# Patient Record
Sex: Male | Born: 1945 | Race: White | Hispanic: No | Marital: Married | Smoking: Never smoker
Health system: Southern US, Community
[De-identification: ages and names within clinical notes are randomized; demographics above are authoritative.]

## PROBLEM LIST (undated history)

## (undated) DIAGNOSIS — I10 Essential (primary) hypertension: Secondary | ICD-10-CM

## (undated) DIAGNOSIS — I4891 Unspecified atrial fibrillation: Secondary | ICD-10-CM

## (undated) DIAGNOSIS — I2510 Atherosclerotic heart disease of native coronary artery without angina pectoris: Secondary | ICD-10-CM

## (undated) DIAGNOSIS — E785 Hyperlipidemia, unspecified: Secondary | ICD-10-CM

## (undated) HISTORY — DX: Atherosclerotic heart disease of native coronary artery without angina pectoris: I25.10

## (undated) HISTORY — DX: Essential (primary) hypertension: I10

## (undated) HISTORY — DX: Unspecified atrial fibrillation: I48.91

## (undated) HISTORY — DX: Hyperlipidemia, unspecified: E78.5

---

## 1999-01-28 ENCOUNTER — Encounter: Payer: Self-pay | Admitting: *Deleted

## 1999-01-28 ENCOUNTER — Ambulatory Visit (HOSPITAL_COMMUNITY): Admission: RE | Admit: 1999-01-28 | Discharge: 1999-01-28 | Payer: Self-pay | Admitting: *Deleted

## 2002-07-10 ENCOUNTER — Encounter: Admission: RE | Admit: 2002-07-10 | Discharge: 2002-07-10 | Payer: Self-pay | Admitting: Internal Medicine

## 2002-07-10 ENCOUNTER — Encounter: Payer: Self-pay | Admitting: Internal Medicine

## 2003-12-13 ENCOUNTER — Encounter: Admission: RE | Admit: 2003-12-13 | Discharge: 2003-12-13 | Payer: Self-pay | Admitting: *Deleted

## 2011-08-21 ENCOUNTER — Encounter (INDEPENDENT_AMBULATORY_CARE_PROVIDER_SITE_OTHER): Payer: Federal, State, Local not specified - PPO | Admitting: Ophthalmology

## 2011-08-21 DIAGNOSIS — H3530 Unspecified macular degeneration: Secondary | ICD-10-CM

## 2011-08-21 DIAGNOSIS — H2510 Age-related nuclear cataract, unspecified eye: Secondary | ICD-10-CM

## 2011-08-21 DIAGNOSIS — H43819 Vitreous degeneration, unspecified eye: Secondary | ICD-10-CM

## 2011-10-01 ENCOUNTER — Encounter (INDEPENDENT_AMBULATORY_CARE_PROVIDER_SITE_OTHER): Payer: Federal, State, Local not specified - PPO | Admitting: Ophthalmology

## 2011-10-05 ENCOUNTER — Encounter (INDEPENDENT_AMBULATORY_CARE_PROVIDER_SITE_OTHER): Payer: Federal, State, Local not specified - PPO | Admitting: Ophthalmology

## 2011-10-05 DIAGNOSIS — H3530 Unspecified macular degeneration: Secondary | ICD-10-CM

## 2011-10-05 DIAGNOSIS — H2510 Age-related nuclear cataract, unspecified eye: Secondary | ICD-10-CM

## 2011-10-05 DIAGNOSIS — H43819 Vitreous degeneration, unspecified eye: Secondary | ICD-10-CM

## 2013-01-11 ENCOUNTER — Encounter (INDEPENDENT_AMBULATORY_CARE_PROVIDER_SITE_OTHER): Payer: Federal, State, Local not specified - PPO | Admitting: Ophthalmology

## 2013-01-11 DIAGNOSIS — I10 Essential (primary) hypertension: Secondary | ICD-10-CM

## 2013-01-11 DIAGNOSIS — H35039 Hypertensive retinopathy, unspecified eye: Secondary | ICD-10-CM

## 2013-01-11 DIAGNOSIS — H2510 Age-related nuclear cataract, unspecified eye: Secondary | ICD-10-CM

## 2013-01-11 DIAGNOSIS — H43819 Vitreous degeneration, unspecified eye: Secondary | ICD-10-CM

## 2013-01-11 DIAGNOSIS — H3530 Unspecified macular degeneration: Secondary | ICD-10-CM

## 2015-11-15 DIAGNOSIS — H40052 Ocular hypertension, left eye: Secondary | ICD-10-CM | POA: Diagnosis not present

## 2015-11-15 DIAGNOSIS — H40051 Ocular hypertension, right eye: Secondary | ICD-10-CM | POA: Diagnosis not present

## 2015-11-15 DIAGNOSIS — H40013 Open angle with borderline findings, low risk, bilateral: Secondary | ICD-10-CM | POA: Diagnosis not present

## 2016-03-16 DIAGNOSIS — K080 Exfoliation of teeth due to systemic causes: Secondary | ICD-10-CM | POA: Diagnosis not present

## 2016-06-11 DIAGNOSIS — Z23 Encounter for immunization: Secondary | ICD-10-CM | POA: Diagnosis not present

## 2016-06-12 DIAGNOSIS — H35033 Hypertensive retinopathy, bilateral: Secondary | ICD-10-CM | POA: Diagnosis not present

## 2016-06-12 DIAGNOSIS — H35363 Drusen (degenerative) of macula, bilateral: Secondary | ICD-10-CM | POA: Diagnosis not present

## 2016-06-12 DIAGNOSIS — H43813 Vitreous degeneration, bilateral: Secondary | ICD-10-CM | POA: Diagnosis not present

## 2016-06-12 DIAGNOSIS — H40013 Open angle with borderline findings, low risk, bilateral: Secondary | ICD-10-CM | POA: Diagnosis not present

## 2016-08-18 DIAGNOSIS — K5730 Diverticulosis of large intestine without perforation or abscess without bleeding: Secondary | ICD-10-CM | POA: Diagnosis not present

## 2016-08-18 DIAGNOSIS — Z79899 Other long term (current) drug therapy: Secondary | ICD-10-CM | POA: Diagnosis not present

## 2016-08-18 DIAGNOSIS — Z0000 Encounter for general adult medical examination without abnormal findings: Secondary | ICD-10-CM | POA: Diagnosis not present

## 2016-08-18 DIAGNOSIS — E559 Vitamin D deficiency, unspecified: Secondary | ICD-10-CM | POA: Diagnosis not present

## 2016-08-18 DIAGNOSIS — I10 Essential (primary) hypertension: Secondary | ICD-10-CM | POA: Diagnosis not present

## 2016-08-18 DIAGNOSIS — Z125 Encounter for screening for malignant neoplasm of prostate: Secondary | ICD-10-CM | POA: Diagnosis not present

## 2016-08-18 DIAGNOSIS — E785 Hyperlipidemia, unspecified: Secondary | ICD-10-CM | POA: Diagnosis not present

## 2016-10-07 DIAGNOSIS — K080 Exfoliation of teeth due to systemic causes: Secondary | ICD-10-CM | POA: Diagnosis not present

## 2016-12-16 DIAGNOSIS — I10 Essential (primary) hypertension: Secondary | ICD-10-CM | POA: Diagnosis not present

## 2016-12-16 DIAGNOSIS — Z23 Encounter for immunization: Secondary | ICD-10-CM | POA: Diagnosis not present

## 2016-12-18 DIAGNOSIS — H40013 Open angle with borderline findings, low risk, bilateral: Secondary | ICD-10-CM | POA: Diagnosis not present

## 2017-03-17 DIAGNOSIS — Z23 Encounter for immunization: Secondary | ICD-10-CM | POA: Diagnosis not present

## 2017-05-11 DIAGNOSIS — H40013 Open angle with borderline findings, low risk, bilateral: Secondary | ICD-10-CM | POA: Diagnosis not present

## 2017-05-11 DIAGNOSIS — H2513 Age-related nuclear cataract, bilateral: Secondary | ICD-10-CM | POA: Diagnosis not present

## 2017-05-11 DIAGNOSIS — H25013 Cortical age-related cataract, bilateral: Secondary | ICD-10-CM | POA: Diagnosis not present

## 2017-05-11 DIAGNOSIS — H00025 Hordeolum internum left lower eyelid: Secondary | ICD-10-CM | POA: Diagnosis not present

## 2017-05-19 DIAGNOSIS — K080 Exfoliation of teeth due to systemic causes: Secondary | ICD-10-CM | POA: Diagnosis not present

## 2017-08-26 DIAGNOSIS — J309 Allergic rhinitis, unspecified: Secondary | ICD-10-CM | POA: Diagnosis not present

## 2017-08-26 DIAGNOSIS — E785 Hyperlipidemia, unspecified: Secondary | ICD-10-CM | POA: Diagnosis not present

## 2017-08-26 DIAGNOSIS — Z0000 Encounter for general adult medical examination without abnormal findings: Secondary | ICD-10-CM | POA: Diagnosis not present

## 2017-08-26 DIAGNOSIS — Z79899 Other long term (current) drug therapy: Secondary | ICD-10-CM | POA: Diagnosis not present

## 2017-08-26 DIAGNOSIS — I10 Essential (primary) hypertension: Secondary | ICD-10-CM | POA: Diagnosis not present

## 2017-08-26 DIAGNOSIS — Z125 Encounter for screening for malignant neoplasm of prostate: Secondary | ICD-10-CM | POA: Diagnosis not present

## 2017-08-26 DIAGNOSIS — E559 Vitamin D deficiency, unspecified: Secondary | ICD-10-CM | POA: Diagnosis not present

## 2017-09-06 DIAGNOSIS — K080 Exfoliation of teeth due to systemic causes: Secondary | ICD-10-CM | POA: Diagnosis not present

## 2017-10-11 DIAGNOSIS — Z01818 Encounter for other preprocedural examination: Secondary | ICD-10-CM | POA: Diagnosis not present

## 2017-10-11 DIAGNOSIS — Z1211 Encounter for screening for malignant neoplasm of colon: Secondary | ICD-10-CM | POA: Diagnosis not present

## 2017-11-15 DIAGNOSIS — H40013 Open angle with borderline findings, low risk, bilateral: Secondary | ICD-10-CM | POA: Diagnosis not present

## 2017-11-15 DIAGNOSIS — H00025 Hordeolum internum left lower eyelid: Secondary | ICD-10-CM | POA: Diagnosis not present

## 2017-11-22 DIAGNOSIS — Z1211 Encounter for screening for malignant neoplasm of colon: Secondary | ICD-10-CM | POA: Diagnosis not present

## 2017-11-22 DIAGNOSIS — K644 Residual hemorrhoidal skin tags: Secondary | ICD-10-CM | POA: Diagnosis not present

## 2017-11-22 DIAGNOSIS — K648 Other hemorrhoids: Secondary | ICD-10-CM | POA: Diagnosis not present

## 2017-11-22 DIAGNOSIS — K5730 Diverticulosis of large intestine without perforation or abscess without bleeding: Secondary | ICD-10-CM | POA: Diagnosis not present

## 2017-12-08 DIAGNOSIS — K080 Exfoliation of teeth due to systemic causes: Secondary | ICD-10-CM | POA: Diagnosis not present

## 2018-01-10 DIAGNOSIS — H01009 Unspecified blepharitis unspecified eye, unspecified eyelid: Secondary | ICD-10-CM | POA: Diagnosis not present

## 2018-01-10 DIAGNOSIS — H00015 Hordeolum externum left lower eyelid: Secondary | ICD-10-CM | POA: Diagnosis not present

## 2018-01-10 DIAGNOSIS — L718 Other rosacea: Secondary | ICD-10-CM | POA: Diagnosis not present

## 2018-05-10 DIAGNOSIS — H40033 Anatomical narrow angle, bilateral: Secondary | ICD-10-CM | POA: Diagnosis not present

## 2018-05-10 DIAGNOSIS — H25013 Cortical age-related cataract, bilateral: Secondary | ICD-10-CM | POA: Diagnosis not present

## 2018-05-10 DIAGNOSIS — H35363 Drusen (degenerative) of macula, bilateral: Secondary | ICD-10-CM | POA: Diagnosis not present

## 2018-05-10 DIAGNOSIS — H35033 Hypertensive retinopathy, bilateral: Secondary | ICD-10-CM | POA: Diagnosis not present

## 2018-05-10 DIAGNOSIS — H2513 Age-related nuclear cataract, bilateral: Secondary | ICD-10-CM | POA: Diagnosis not present

## 2018-05-10 DIAGNOSIS — H40013 Open angle with borderline findings, low risk, bilateral: Secondary | ICD-10-CM | POA: Diagnosis not present

## 2018-06-02 DIAGNOSIS — Z23 Encounter for immunization: Secondary | ICD-10-CM | POA: Diagnosis not present

## 2018-06-29 DIAGNOSIS — K080 Exfoliation of teeth due to systemic causes: Secondary | ICD-10-CM | POA: Diagnosis not present

## 2018-09-06 DIAGNOSIS — K080 Exfoliation of teeth due to systemic causes: Secondary | ICD-10-CM | POA: Diagnosis not present

## 2018-09-07 DIAGNOSIS — K080 Exfoliation of teeth due to systemic causes: Secondary | ICD-10-CM | POA: Diagnosis not present

## 2018-09-08 ENCOUNTER — Other Ambulatory Visit: Payer: Self-pay | Admitting: Internal Medicine

## 2018-09-08 DIAGNOSIS — Z125 Encounter for screening for malignant neoplasm of prostate: Secondary | ICD-10-CM | POA: Diagnosis not present

## 2018-09-08 DIAGNOSIS — E559 Vitamin D deficiency, unspecified: Secondary | ICD-10-CM | POA: Diagnosis not present

## 2018-09-08 DIAGNOSIS — N5201 Erectile dysfunction due to arterial insufficiency: Secondary | ICD-10-CM | POA: Diagnosis not present

## 2018-09-08 DIAGNOSIS — Z8249 Family history of ischemic heart disease and other diseases of the circulatory system: Secondary | ICD-10-CM

## 2018-09-08 DIAGNOSIS — Z0000 Encounter for general adult medical examination without abnormal findings: Secondary | ICD-10-CM | POA: Diagnosis not present

## 2018-09-08 DIAGNOSIS — E785 Hyperlipidemia, unspecified: Secondary | ICD-10-CM | POA: Diagnosis not present

## 2018-09-08 DIAGNOSIS — I10 Essential (primary) hypertension: Secondary | ICD-10-CM | POA: Diagnosis not present

## 2018-10-17 ENCOUNTER — Ambulatory Visit
Admission: RE | Admit: 2018-10-17 | Discharge: 2018-10-17 | Disposition: A | Discharge status: Home / Self Care | Payer: No Typology Code available for payment source | Source: Ambulatory Visit | Attending: Internal Medicine | Admitting: Internal Medicine

## 2018-10-17 DIAGNOSIS — Z8249 Family history of ischemic heart disease and other diseases of the circulatory system: Secondary | ICD-10-CM

## 2018-11-15 DIAGNOSIS — I10 Essential (primary) hypertension: Secondary | ICD-10-CM | POA: Diagnosis not present

## 2018-11-15 DIAGNOSIS — I2510 Atherosclerotic heart disease of native coronary artery without angina pectoris: Secondary | ICD-10-CM | POA: Diagnosis not present

## 2018-12-14 DIAGNOSIS — I2510 Atherosclerotic heart disease of native coronary artery without angina pectoris: Secondary | ICD-10-CM | POA: Diagnosis not present

## 2019-03-14 DIAGNOSIS — I2510 Atherosclerotic heart disease of native coronary artery without angina pectoris: Secondary | ICD-10-CM | POA: Diagnosis not present

## 2019-03-14 DIAGNOSIS — E785 Hyperlipidemia, unspecified: Secondary | ICD-10-CM | POA: Diagnosis not present

## 2019-03-14 DIAGNOSIS — I10 Essential (primary) hypertension: Secondary | ICD-10-CM | POA: Diagnosis not present

## 2019-04-08 DIAGNOSIS — Z03818 Encounter for observation for suspected exposure to other biological agents ruled out: Secondary | ICD-10-CM | POA: Diagnosis not present

## 2019-05-31 DIAGNOSIS — Z23 Encounter for immunization: Secondary | ICD-10-CM | POA: Diagnosis not present

## 2019-07-29 DIAGNOSIS — Z20828 Contact with and (suspected) exposure to other viral communicable diseases: Secondary | ICD-10-CM | POA: Diagnosis not present

## 2019-08-24 DIAGNOSIS — H40033 Anatomical narrow angle, bilateral: Secondary | ICD-10-CM | POA: Diagnosis not present

## 2019-08-24 DIAGNOSIS — H40013 Open angle with borderline findings, low risk, bilateral: Secondary | ICD-10-CM | POA: Diagnosis not present

## 2019-08-24 DIAGNOSIS — H524 Presbyopia: Secondary | ICD-10-CM | POA: Diagnosis not present

## 2019-09-04 ENCOUNTER — Ambulatory Visit: Payer: Federal, State, Local not specified - PPO | Attending: Internal Medicine

## 2019-09-04 DIAGNOSIS — Z23 Encounter for immunization: Secondary | ICD-10-CM | POA: Insufficient documentation

## 2019-09-04 NOTE — Progress Notes (Signed)
   Covid-19 Vaccination Clinic  Name:  David Munoz    MRN: [id number] DOB: 1946-07-29  09/04/2019  Mr. Morning was observed post Covid-19 immunization for 15 minutes without incidence. He was provided with Vaccine Information Sheet and instruction to access the V-Safe system.   [name] was instructed to call 911 with any severe reactions post vaccine: Marland Kitchen Difficulty breathing  . Swelling of your face and throat  . A fast heartbeat  . A bad rash all over your body  . Dizziness and weakness    Immunizations Administered    Name Date Dose VIS Date Route   Pfizer COVID-19 Vaccine 09/04/2019  3:08 PM 0.3 mL 07/21/2019 Intramuscular   Manufacturer: [employer], [employer]   Lot: [id number]   NDC: [address]-9

## 2019-09-19 DIAGNOSIS — Z0000 Encounter for general adult medical examination without abnormal findings: Secondary | ICD-10-CM | POA: Diagnosis not present

## 2019-09-19 DIAGNOSIS — Z125 Encounter for screening for malignant neoplasm of prostate: Secondary | ICD-10-CM | POA: Diagnosis not present

## 2019-09-19 DIAGNOSIS — I2510 Atherosclerotic heart disease of native coronary artery without angina pectoris: Secondary | ICD-10-CM | POA: Diagnosis not present

## 2019-09-19 DIAGNOSIS — N5201 Erectile dysfunction due to arterial insufficiency: Secondary | ICD-10-CM | POA: Diagnosis not present

## 2019-09-19 DIAGNOSIS — Z79899 Other long term (current) drug therapy: Secondary | ICD-10-CM | POA: Diagnosis not present

## 2019-09-19 DIAGNOSIS — K5730 Diverticulosis of large intestine without perforation or abscess without bleeding: Secondary | ICD-10-CM | POA: Diagnosis not present

## 2019-09-19 DIAGNOSIS — E559 Vitamin D deficiency, unspecified: Secondary | ICD-10-CM | POA: Diagnosis not present

## 2019-09-19 DIAGNOSIS — E785 Hyperlipidemia, unspecified: Secondary | ICD-10-CM | POA: Diagnosis not present

## 2019-09-19 DIAGNOSIS — I10 Essential (primary) hypertension: Secondary | ICD-10-CM | POA: Diagnosis not present

## 2019-09-25 ENCOUNTER — Ambulatory Visit: Payer: Federal, State, Local not specified - PPO | Attending: Internal Medicine

## 2019-09-25 DIAGNOSIS — Z23 Encounter for immunization: Secondary | ICD-10-CM | POA: Insufficient documentation

## 2019-09-25 NOTE — Progress Notes (Signed)
   Covid-19 Vaccination Clinic  Name:  David Munoz    MRN: [id number] DOB: 1946/05/20  09/25/2019  [name] was observed post Covid-19 immunization for 15 minutes without incidence. He was provided with Vaccine Information Sheet and instruction to access the V-Safe system.   [name] was instructed to call 911 with any severe reactions post vaccine: Marland Kitchen Difficulty breathing  . Swelling of your face and throat  . A fast heartbeat  . A bad rash all over your body  . Dizziness and weakness    Immunizations Administered    Name Date Dose VIS Date Route   Pfizer COVID-19 Vaccine 09/25/2019  9:56 AM 0.3 mL 07/21/2019 Intramuscular   Manufacturer: [employer], [employer]   Lot: [id number]   NDC: [address]-7

## 2019-10-02 DIAGNOSIS — I10 Essential (primary) hypertension: Secondary | ICD-10-CM | POA: Diagnosis not present

## 2019-10-02 DIAGNOSIS — K5730 Diverticulosis of large intestine without perforation or abscess without bleeding: Secondary | ICD-10-CM | POA: Diagnosis not present

## 2019-10-02 DIAGNOSIS — I2510 Atherosclerotic heart disease of native coronary artery without angina pectoris: Secondary | ICD-10-CM | POA: Diagnosis not present

## 2019-10-05 ENCOUNTER — Other Ambulatory Visit: Payer: Self-pay

## 2019-10-06 ENCOUNTER — Other Ambulatory Visit: Payer: Self-pay

## 2019-10-06 ENCOUNTER — Ambulatory Visit: Payer: Federal, State, Local not specified - PPO | Admitting: Cardiovascular Disease

## 2019-10-06 ENCOUNTER — Other Ambulatory Visit: Payer: Self-pay | Admitting: *Deleted

## 2019-10-06 VITALS — BP 122/80 | HR 71 | Ht 67.0 in | Wt 155.0 lb

## 2019-10-06 DIAGNOSIS — Z01818 Encounter for other preprocedural examination: Secondary | ICD-10-CM

## 2019-10-06 DIAGNOSIS — I2584 Coronary atherosclerosis due to calcified coronary lesion: Secondary | ICD-10-CM

## 2019-10-06 DIAGNOSIS — I10 Essential (primary) hypertension: Secondary | ICD-10-CM | POA: Diagnosis not present

## 2019-10-06 DIAGNOSIS — E7800 Pure hypercholesterolemia, unspecified: Secondary | ICD-10-CM | POA: Diagnosis not present

## 2019-10-06 DIAGNOSIS — I712 Thoracic aortic aneurysm, without rupture: Secondary | ICD-10-CM

## 2019-10-06 DIAGNOSIS — I2510 Atherosclerotic heart disease of native coronary artery without angina pectoris: Secondary | ICD-10-CM

## 2019-10-06 DIAGNOSIS — I7121 Aneurysm of the ascending aorta, without rupture: Secondary | ICD-10-CM

## 2019-10-06 NOTE — Patient Instructions (Signed)
Medication Instructions:  No changes *If you need a refill on your cardiac medications before your next appointment, please call your pharmacy*   Lab Work: Your provider would like for you to return in one week before the test to have the following labs drawn: BMET. You do not need an appointment for the lab. Once in our office lobby there is a podium where you can sign in and ring the doorbell to alert [place] that you are here. The lab is open from 8:00 am to 4:30 pm; closed for lunch from 12:45pm-1:45pm.  If you have labs (blood work) drawn today and your tests are completely normal, you will receive your results only by: Marland Kitchen MyChart Message (if you have MyChart) OR . A paper copy in the mail If you have any lab test that is abnormal or we need to change your treatment, we will call you to review the results.   Testing/Procedures: Non-Cardiac CT Angiography (CTA), is a special type of CT scan that uses a computer to produce multi-dimensional views of major blood vessels throughout the body. In CT angiography, a contrast material is injected through an IV to help visualize the blood vessels  You will need to have a BMET lab before the test.     Follow-Up: At [facility], you and your health needs are our priority.  As part of our continuing mission to provide you with exceptional heart care, we have created designated Provider Care Teams.  These Care Teams include your primary Cardiologist (physician) and Advanced Practice Providers (APPs -  Physician Assistants and Nurse Practitioners) who all work together to provide you with the care you need, when you need it.  We recommend signing up for the patient portal called "MyChart".  Sign up information is provided on this After Visit Summary.  MyChart is used to connect with patients for Virtual Visits (Telemedicine).  Patients are able to view lab/test results, encounter notes, upcoming appointments, etc.  Non-urgent messages can be sent to your  provider as well.   To learn more about what you can do with MyChart, go to [email or web address].    Your next appointment:   12 month(s)  The format for your next appointment:   In Person  Provider:   You may see Thurmon Fair, MD or one of the following Advanced Practice Providers on your designated Care Team:    Azalee Course, PA-C  Micah Flesher, PA-C or   Judy Pimple, [place]

## 2019-10-06 NOTE — Progress Notes (Signed)
Cardiology Office Note:    Date:  10/07/2019   ID:  David Munoz, DOB 09/05/1945, MRN [id number]  PCP:  Josetta Huddle, MD  Cardiologist:  No primary care provider on file.  Electrophysiologist:  None   Referring MD: Josetta Huddle, MD   No chief complaint on file. David Munoz is a 74 y.o. male who is being seen today for the evaluation of coronary calcification at the request of Josetta Huddle, MD.   History of Present Illness:    David Munoz is a 74 y.o. male with a hx of remarkably good health, for treated hypertension and hypercholesterolemia, started on statin after he was found to have a calcium score of 536 (74th percentile for age and gender).  The CT also showed incidental finding of mild aneurysmal dilatation of the ascending aorta at 42 mm.  He is concerned whether he should do more to reduce his risk of coronary complications.  Both his paternal and maternal grandfather died in their late 85s from myocardial infarction.  His father had bypass surgery at age 22 but lived to be 72 years old.  He has a brother that is 5 years younger who has undergone cardiac catheterization and percutaneous intervention.  He is very active.  He walks and sometimes jogs 3-4 miles a day, almost every day of the week.  He eats a very healthy diet.  His BMI is 24.  His blood pressure is well controlled.  His most recent LDL cholesterol just a few days ago was 63 and his HDL was 56.  He does not have diabetes mellitus.  He has normal renal function.  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  Past Medical History:  Diagnosis Date  . Coronary artery disease   . Hyperlipidemia   . Hypertension     History reviewed. No pertinent surgical history.  Current Medications: Current Meds  Medication Sig  . amLODipine (NORVASC) 5 MG  tablet Take 5 mg by mouth daily.  Marland Kitchen atorvastatin (LIPITOR) 20 MG tablet Take 20 mg by mouth at bedtime.  . metoprolol succinate (TOPROL-XL) 25 MG 24 hr tablet Take 25 mg by mouth daily.  . valsartan (DIOVAN) 160 MG tablet Take 160 mg by mouth daily.     Allergies:   Patient has no allergy information on record.   Social History   Socioeconomic History  . Marital status: Married    Spouse name: Not on file  . Number of children: Not on file  . Years of education: Not on file  . Highest education level: Not on file  Occupational History  . Not on file  Tobacco Use  . Smoking status: Never Smoker  . Smokeless tobacco: Never Used  Substance and Sexual Activity  . Alcohol use: Not on file  . Drug use: Never  . Sexual activity: Not on file  Other Topics Concern  . Not on file  Social History Narrative  . Not on file   Social Determinants of Health   Financial Resource Strain:   . Difficulty of Paying Living Expenses: Not on file  Food Insecurity:   . Worried About Charity fundraiser in the Last Year: Not on file  . Ran Out of Food in the Last Year: Not on file  Transportation Needs:   . Lack of Transportation (Medical): Not  on file  . Lack of Transportation (Non-Medical): Not on file  Physical Activity:   . Days of Exercise per Week: Not on file  . Minutes of Exercise per Session: Not on file  Stress:   . Feeling of Stress : Not on file  Social Connections:   . Frequency of Communication with Friends and Family: Not on file  . Frequency of Social Gatherings with Friends and Family: Not on file  . Attends Religious Services: Not on file  . Active Member of Clubs or Organizations: Not on file  . Attends Banker Meetings: Not on file  . Marital Status: Not on file     Family History: The patient's family history includes Heart attack in his maternal grandfather and paternal grandfather; Heart disease in his father.  ROS:   Please see the history of  present illness.     All other systems reviewed and are negative.  EKGs/Labs/Other Studies Reviewed:    The following studies were reviewed today: Notes from Dr. Kevan Ny.  Coronary calcium score CT.  EKG:  EKG is  ordered today.  The ekg ordered today demonstrates sinus rhythm with a single PVC, nonspecific diffuse ST segment elevation suggesting early repolarization.  Recent Labs: No results found for requested labs within last 8760 hours.  09/19/2019 Creatinine 1.0, potassium 4.3, normal liver function tests, glucose 88, hemoglobin 13.6 Recent Lipid Panel No results found for: CHOL, TRIG, HDL, CHOLHDL, VLDL, LDLCALC, LDLDIRECT 09/19/2019  total cholesterol 136, triglycerides 84, HDL 56, LDL 63 Physical Exam:    VS:  BP 122/80   Pulse 71   Ht 5\' 7"  (1.702 m)   Wt 155 lb (70.3 kg)   SpO2 98%   BMI 24.28 kg/m     Wt Readings from Last 3 Encounters:  10/06/19 155 lb (70.3 kg)     GEN: Appears lean and fit, well nourished, well developed in no acute distress HEENT: Normal NECK: No JVD; No carotid bruits LYMPHATICS: No lymphadenopathy CARDIAC: RRR, no murmurs, rubs, gallops RESPIRATORY:  Clear to auscultation without rales, wheezing or rhonchi  ABDOMEN: Soft, non-tender, non-distended MUSCULOSKELETAL:  No edema; No deformity  SKIN: Warm and dry NEUROLOGIC:  Alert and oriented x 3 PSYCHIATRIC:  Normal affect   ASSESSMENT:    1. Aneurysm of ascending aorta (HCC)   2. Pre-op testing   3. Essential hypertension   4. Hypercholesterolemia   5. Coronary artery calcification    PLAN:    In order of problems listed above:  1. Asc Ao Aneurysm: Reviewed the fact that measurements on noncontrast studies may be less accurate.  Should follow-up in a year with a contrast based CT.  His blood pressure is very well controlled. 2. HTN: Well-controlled.  Discussed the rationale for using multiple agents.  History of photosensitivity with hydrochlorothiazide.  Reportedly had angioedema  with lisinopril but seems to tolerate the angiotensin receptor blocker well. 3. HLP: His calcium score is quite high and I agree with Dr. 10/08/19 decision to recommend lipid-lowering therapy, with a target LDL less than 70, which is achieved on the current medications. 4. Coronary calcification: Reviewed the fact that this shows significant burden of atherosclerosis, but not necessarily any significant stenoses.  He has excellent functional status and is very physically active.  A treadmill stress test is a reasonable study, but we both agreed to delay this until the coronavirus restrictions have been lifted.   Medication Adjustments/Labs and Tests Ordered: Current medicines are reviewed at length with the  patient today.  Concerns regarding medicines are outlined above.  Orders Placed This Encounter  Procedures  . CT ANGIO CHEST AORTA W/CM & OR WO/CM  . Basic metabolic panel  . EKG 12-Lead   No orders of the defined types were placed in this encounter.   Patient Instructions  Medication Instructions:  No changes *If you need a refill on your cardiac medications before your next appointment, please call your pharmacy*   Lab Work: Your provider would like for you to return in one week before the test to have the following labs drawn: BMET. You do not need an appointment for the lab. Once in our office lobby there is a podium where you can sign in and ring the doorbell to alert [place] that you are here. The lab is open from 8:00 am to 4:30 pm; closed for lunch from 12:45pm-1:45pm.  If you have labs (blood work) drawn today and your tests are completely normal, you will receive your results only by: Marland Kitchen MyChart Message (if you have MyChart) OR . A paper copy in the mail If you have any lab test that is abnormal or we need to change your treatment, we will call you to review the results.   Testing/Procedures: Non-Cardiac CT Angiography (CTA), is a special type of CT scan that uses a computer to  produce multi-dimensional views of major blood vessels throughout the body. In CT angiography, a contrast material is injected through an IV to help visualize the blood vessels  You will need to have a BMET lab before the test.     Follow-Up: At [facility], you and your health needs are our priority.  As part of our continuing mission to provide you with exceptional heart care, we have created designated Provider Care Teams.  These Care Teams include your primary Cardiologist (physician) and Advanced Practice Providers (APPs -  Physician Assistants and Nurse Practitioners) who all work together to provide you with the care you need, when you need it.  We recommend signing up for the patient portal called "MyChart".  Sign up information is provided on this After Visit Summary.  MyChart is used to connect with patients for Virtual Visits (Telemedicine).  Patients are able to view lab/test results, encounter notes, upcoming appointments, etc.  Non-urgent messages can be sent to your provider as well.   To learn more about what you can do with MyChart, go to [email or web address].    Your next appointment:   12 month(s)  The format for your next appointment:   In Person  Provider:   You may see Thurmon Fair, MD or one of the following Advanced Practice Providers on your designated Care Team:    Azalee Course, PA-C  Micah Flesher, [place] or   Judy Pimple, PA-C      Signed, Thurmon Fair, MD  10/07/2019 12:27 PM    [facility]

## 2019-10-07 ENCOUNTER — Encounter: Payer: Self-pay | Admitting: Cardiovascular Disease

## 2019-10-12 ENCOUNTER — Telehealth: Payer: Self-pay | Admitting: Cardiovascular Disease

## 2019-10-12 NOTE — Telephone Encounter (Signed)
Spoke with patient regarding appointment for CTA chest aorta scheduled 10/23/19 at 3:30 pm at [facility]---arrival time is 3:15 pm 1st floor radiology department----liquids only 4 hours prior to study.  Patient instructed to have labs 1 week prior to appointment---he states she had those completed at his PCP about 2 week ago.  It as suggested he contact that office and have a copy of those results sent to [place].  Patient sated he may come in and have them repeated.

## 2019-10-16 DIAGNOSIS — I712 Thoracic aortic aneurysm, without rupture: Secondary | ICD-10-CM | POA: Diagnosis not present

## 2019-10-16 DIAGNOSIS — Z01818 Encounter for other preprocedural examination: Secondary | ICD-10-CM | POA: Diagnosis not present

## 2019-10-17 LAB — BASIC METABOLIC PANEL
BUN/Creatinine Ratio: 15 (ref 10–24)
BUN: 17 mg/dL (ref 8–27)
CO2: 25 mmol/L (ref 20–29)
Calcium: 9.6 mg/dL (ref 8.6–10.2)
Chloride: 102 mmol/L (ref 96–106)
Creatinine, Ser: 1.11 mg/dL (ref 0.76–1.27)
GFR calc Af Amer: 77 mL/min/{1.73_m2} (ref >59)
GFR calc non Af Amer: 66 mL/min/{1.73_m2} (ref >59)
Glucose: 78 mg/dL (ref 65–99)
Potassium: 4.6 mmol/L (ref 3.5–5.2)
Sodium: 143 mmol/L (ref 134–144)

## 2019-10-23 ENCOUNTER — Ambulatory Visit (HOSPITAL_COMMUNITY)
Admission: RE | Admit: 2019-10-23 | Discharge: 2019-10-23 | Disposition: A | Discharge status: Home / Self Care | Payer: Federal, State, Local not specified - PPO | Source: Ambulatory Visit | Attending: Cardiovascular Disease | Admitting: Cardiovascular Disease

## 2019-10-23 ENCOUNTER — Other Ambulatory Visit: Payer: Self-pay

## 2019-10-23 DIAGNOSIS — I712 Thoracic aortic aneurysm, without rupture: Secondary | ICD-10-CM | POA: Insufficient documentation

## 2019-10-23 DIAGNOSIS — Z01818 Encounter for other preprocedural examination: Secondary | ICD-10-CM | POA: Diagnosis not present

## 2019-10-23 DIAGNOSIS — I7121 Aneurysm of the ascending aorta, without rupture: Secondary | ICD-10-CM

## 2019-10-23 MED ORDER — IOHEXOL 350 MG/ML SOLN
80.0000 mL | Freq: Once | INTRAVENOUS | Status: AC | PRN
  Administered 2019-10-23: 80 mL via INTRAVENOUS

## 2020-03-10 DEATH — deceased

## 2020-04-23 DIAGNOSIS — H2513 Age-related nuclear cataract, bilateral: Secondary | ICD-10-CM | POA: Diagnosis not present

## 2020-04-23 DIAGNOSIS — H40013 Open angle with borderline findings, low risk, bilateral: Secondary | ICD-10-CM | POA: Diagnosis not present

## 2020-04-23 DIAGNOSIS — H35363 Drusen (degenerative) of macula, bilateral: Secondary | ICD-10-CM | POA: Diagnosis not present

## 2020-04-23 DIAGNOSIS — H40033 Anatomical narrow angle, bilateral: Secondary | ICD-10-CM | POA: Diagnosis not present

## 2020-08-28 DIAGNOSIS — Z20822 Contact with and (suspected) exposure to covid-19: Secondary | ICD-10-CM | POA: Diagnosis not present

## 2020-08-29 DIAGNOSIS — Z1159 Encounter for screening for other viral diseases: Secondary | ICD-10-CM | POA: Diagnosis not present

## 2020-10-08 DIAGNOSIS — N5201 Erectile dysfunction due to arterial insufficiency: Secondary | ICD-10-CM | POA: Diagnosis not present

## 2020-10-08 DIAGNOSIS — I712 Thoracic aortic aneurysm, without rupture: Secondary | ICD-10-CM | POA: Diagnosis not present

## 2020-10-08 DIAGNOSIS — Z79899 Other long term (current) drug therapy: Secondary | ICD-10-CM | POA: Diagnosis not present

## 2020-10-08 DIAGNOSIS — Z23 Encounter for immunization: Secondary | ICD-10-CM | POA: Diagnosis not present

## 2020-10-08 DIAGNOSIS — E785 Hyperlipidemia, unspecified: Secondary | ICD-10-CM | POA: Diagnosis not present

## 2020-10-08 DIAGNOSIS — I2510 Atherosclerotic heart disease of native coronary artery without angina pectoris: Secondary | ICD-10-CM | POA: Diagnosis not present

## 2020-10-08 DIAGNOSIS — E559 Vitamin D deficiency, unspecified: Secondary | ICD-10-CM | POA: Diagnosis not present

## 2020-10-08 DIAGNOSIS — Z0000 Encounter for general adult medical examination without abnormal findings: Secondary | ICD-10-CM | POA: Diagnosis not present

## 2020-10-08 DIAGNOSIS — I10 Essential (primary) hypertension: Secondary | ICD-10-CM | POA: Diagnosis not present

## 2020-10-15 DIAGNOSIS — H00025 Hordeolum internum left lower eyelid: Secondary | ICD-10-CM | POA: Diagnosis not present

## 2020-11-29 ENCOUNTER — Ambulatory Visit: Payer: Federal, State, Local not specified - PPO | Admitting: Cardiovascular Disease

## 2020-12-10 ENCOUNTER — Ambulatory Visit: Payer: Federal, State, Local not specified - PPO | Admitting: Cardiovascular Disease

## 2020-12-10 ENCOUNTER — Encounter: Payer: Self-pay | Admitting: Cardiovascular Disease

## 2020-12-10 ENCOUNTER — Other Ambulatory Visit: Payer: Self-pay

## 2020-12-10 VITALS — BP 142/80 | HR 68 | Ht 67.0 in | Wt 161.6 lb

## 2020-12-10 DIAGNOSIS — I712 Thoracic aortic aneurysm, without rupture, unspecified: Secondary | ICD-10-CM

## 2020-12-10 DIAGNOSIS — I2510 Atherosclerotic heart disease of native coronary artery without angina pectoris: Secondary | ICD-10-CM

## 2020-12-10 DIAGNOSIS — E7800 Pure hypercholesterolemia, unspecified: Secondary | ICD-10-CM | POA: Diagnosis not present

## 2020-12-10 DIAGNOSIS — I10 Essential (primary) hypertension: Secondary | ICD-10-CM | POA: Diagnosis not present

## 2020-12-10 DIAGNOSIS — Z01812 Encounter for preprocedural laboratory examination: Secondary | ICD-10-CM | POA: Diagnosis not present

## 2020-12-10 DIAGNOSIS — I2584 Coronary atherosclerosis due to calcified coronary lesion: Secondary | ICD-10-CM

## 2020-12-10 MED ORDER — VALSARTAN 320 MG PO TABS: 320.0000 mg | ORAL_TABLET | Freq: Every day | ORAL | 3 refills | Status: AC

## 2020-12-10 NOTE — Patient Instructions (Signed)
Medication Instructions:  INCREASE the Valsartan to 320 mg once daily  *If you need a refill on your cardiac medications before your next appointment, please call your pharmacy*   Lab Work: BMET before the CT If you have labs (blood work) drawn today and your tests are completely normal, you will receive your results only by: Marland Kitchen MyChart Message (if you have MyChart) OR . A paper copy in the mail If you have any lab test that is abnormal or we need to change your treatment, we will call you to review the results.   Testing/Procedures: Dr. Royann Shivers has ordered a CT Angio of the Chest Aorta.  Non-Cardiac CT Angiography (CTA), is a special type of CT scan that uses a computer to produce multi-dimensional views of major blood vessels throughout the body. In CT angiography, a contrast material is injected through an IV to help visualize the blood vessels  Hold Valsartan the morning of the test   Follow-Up: At [facility], you and your health needs are our priority.  As part of our continuing mission to provide you with exceptional heart care, we have created designated Provider Care Teams.  These Care Teams include your primary Cardiologist (physician) and Advanced Practice Providers (APPs -  Physician Assistants and Nurse Practitioners) who all work together to provide you with the care you need, when you need it.  We recommend signing up for the patient portal called "MyChart".  Sign up information is provided on this After Visit Summary.  MyChart is used to connect with patients for Virtual Visits (Telemedicine).  Patients are able to view lab/test results, encounter notes, upcoming appointments, etc.  Non-urgent messages can be sent to your provider as well.   To learn more about what you can do with MyChart, go to [email or web address].    Your next appointment:   12 month(s)  The format for your next appointment:   In Person  Provider:   You may see Thurmon Fair, MD or one of  the following Advanced Practice Providers on your designated Care Team:    Azalee Course, PA-C  Micah Flesher, PA-C or   Judy Pimple, [place]

## 2020-12-10 NOTE — Progress Notes (Signed)
Cardiology Office Note:    Date:  12/10/2020   ID:  AERO DRUMMONDS, DOB [birth year], MRN [id number]  PCP:  Marden Noble, MD  Cardiologist:  Thurmon Fair, MD  Electrophysiologist:  None   Referring MD: Marden Noble, MD   Chief Complaint  Patient presents with  . Thoracic Aortic Aneurysm     History of Present Illness:    David Munoz is a 75 y.o. male with a hx of hypertension and hypercholesterolemia, started on statin after he was found to have a calcium score of 536 (74th percentile for age and gender).  The CT also showed incidental finding of mild aneurysmal dilatation of the ascending aorta at 43 mm.  Both his paternal and maternal grandfather died in their late 73s from myocardial infarction.  His father had bypass surgery at age 67 but lived to be 73 years old.  He has a brother that is 5 years younger who has undergone cardiac catheterization and percutaneous intervention.  He is still very active (walks and jogs most days of the week).  He walks and sometimes jogs 3-4 miles a day, almost every day of the week.  His BP is usually 130s/80.  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 His first granddaughter was born in 2020 and he enjoys visiting her weekly in [place].  Past Medical History:  Diagnosis Date  . Coronary artery disease   . Hyperlipidemia   . Hypertension     History reviewed. No pertinent surgical history.  Current Medications: Current Meds  Medication Sig  . amLODipine (NORVASC) 5 MG tablet Take 5 mg by mouth daily.  Marland Kitchen atorvastatin (LIPITOR) 20 MG tablet Take 20 mg by mouth at bedtime.  . metoprolol succinate (TOPROL-XL) 25 MG 24 hr tablet Take 25 mg by mouth daily.  . [DISCONTINUED] valsartan (DIOVAN) 160 MG tablet Take 160 mg by mouth daily.      Allergies:   Patient has no allergy information on record.   Social History   Socioeconomic History  . Marital status: Married    Spouse name: Not on file  . Number of children: Not on file  . Years of education: Not on file  . Highest education level: Not on file  Occupational History  . Not on file  Tobacco Use  . Smoking status: Never Smoker  . Smokeless tobacco: Never Used  Substance and Sexual Activity  . Alcohol use: Not on file  . Drug use: Never  . Sexual activity: Not on file  Other Topics Concern  . Not on file  Social History Narrative  . Not on file   Social Determinants of Health   Financial Resource Strain: Not on file  Food Insecurity: Not on file  Transportation Needs: Not on file  Physical Activity: Not on file  Stress: Not on file  Social Connections: Not on file     Family History: The patient's family history includes Heart attack in his maternal grandfather and paternal grandfather; Heart disease in his father.  ROS:   Please see the history of present illness.   All other systems are reviewed and are negative.   EKGs/Labs/Other Studies Reviewed:    The following studies were reviewed today: CTA Aorta 10/23/2019  EKG:  EKG is  ordered today.  It is a normal tracing,  NSR.  Recent Labs: No results found for requested labs within last 8760 hours.  09/19/2019 Creatinine 1.0, potassium 4.3, normal liver function tests, glucose 88, hemoglobin 13.6 10/08/2020 Creatinine 1.1, K 4.4, Hgb 13.7, TSH 0.67 Recent Lipid Panel No results found for: CHOL, TRIG, HDL, CHOLHDL, VLDL, LDLCALC, LDLDIRECT 09/19/2019  total cholesterol 136, triglycerides 84, HDL 56, LDL 63 10/08/2020 total cholesterol 144, triglycerides 45, HDL 67, LDL 67 Physical Exam:    VS:  BP (!) 142/80   Pulse 68   Ht 5\' 7"  (1.702 m)   Wt 161 lb 9.6 oz (73.3 kg)   SpO2 99%   BMI 25.31 kg/m     Wt Readings from Last 3 Encounters:  12/10/20 161 lb 9.6 oz (73.3 kg)  10/06/19 155  lb (70.3 kg)      General: Alert, oriented x3, no distress, fit, appears younger than stated age Head: no evidence of trauma, PERRL, EOMI, no exophtalmos or lid lag, no myxedema, no xanthelasma; normal ears, nose and oropharynx Neck: normal jugular venous pulsations and no hepatojugular reflux; brisk carotid pulses without delay and no carotid bruits Chest: clear to auscultation, no signs of consolidation by percussion or palpation, normal fremitus, symmetrical and full respiratory excursions Cardiovascular: normal position and quality of the apical impulse, regular rhythm, normal first and second heart sounds, no murmurs, rubs or gallops Abdomen: no tenderness or distention, no masses by palpation, no abnormal pulsatility or arterial bruits, normal bowel sounds, no hepatosplenomegaly Extremities: no clubbing, cyanosis or edema; 2+ radial, ulnar and brachial pulses bilaterally; 2+ right femoral, posterior tibial and dorsalis pedis pulses; 2+ left femoral, posterior tibial and dorsalis pedis pulses; no subclavian or femoral bruits Neurological: grossly nonfocal Psych: Normal mood and affect   ASSESSMENT:    1. Pre-procedure lab exam   2. 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 Essential hypertension   4. Hypercholesterolemia   5. Coronary artery calcification    PLAN:    In order of problems listed above:  1. Asc Ao Aneurysm: Time for follow up CTA.  2. HTN: Ideally SBP<130, will increase the valsartan to 320 mg daily.  History of photosensitivity with hydrochlorothiazide.  Reportedly had angioedema with lisinopril but seems to tolerate the angiotensin receptor blocker well. 3. HLP: His calcium score is quite high. LDL-C is at target on statin. 4. Coronary calcification: Reviewed the fact that this shows significant burden of atherosclerosis, but not necessarily any significant stenoses.  He has excellent functional status and is very physically active.  Asymptomatic. Will  need an angiogram if the aortic aneurysm enlarges and we are contemplating surgery.  Medication Adjustments/Labs and Tests Ordered: Current medicines are reviewed at length with the patient today.  Concerns regarding medicines are outlined above.  Orders Placed This Encounter  Procedures  . CT ANGIO CHEST AORTA W/CM & OR WO/CM  . Basic metabolic panel  . EKG 12-Lead   Meds ordered this encounter  Medications  . valsartan (DIOVAN) 320 MG tablet    Sig: Take 1 tablet (320 mg total) by mouth daily.    Dispense:  90 tablet    Refill:  3    Patient Instructions  Medication Instructions:  INCREASE the Valsartan to 320 mg once daily  *If you need a refill on your cardiac medications before your next appointment, please call your pharmacy*   Lab Work: BMET before the CT If you have labs (blood work) drawn today and your tests are completely normal, you will receive your  results only by: Marland Kitchen MyChart Message (if you have MyChart) OR . A paper copy in the mail If you have any lab test that is abnormal or we need to change your treatment, we will call you to review the results.   Testing/Procedures: Dr. Royann Shivers has ordered a CT Angio of the Chest Aorta.  Non-Cardiac CT Angiography (CTA), is a special type of CT scan that uses a computer to produce multi-dimensional views of major blood vessels throughout the body. In CT angiography, a contrast material is injected through an IV to help visualize the blood vessels  Hold Valsartan the morning of the test   Follow-Up: At [facility], you and your health needs are our priority.  As part of our continuing mission to provide you with exceptional heart care, we have created designated Provider Care Teams.  These Care Teams include your primary Cardiologist (physician) and Advanced Practice Providers (APPs -  Physician Assistants and Nurse Practitioners) who all work together to provide you with the care you need, when you need it.  We  recommend signing up for the patient portal called "MyChart".  Sign up information is provided on this After Visit Summary.  MyChart is used to connect with patients for Virtual Visits (Telemedicine).  Patients are able to view lab/test results, encounter notes, upcoming appointments, etc.  Non-urgent messages can be sent to your provider as well.   To learn more about what you can do with MyChart, go to [email or web address].    Your next appointment:   12 month(s)  The format for your next appointment:   In Person  Provider:   You may see Thurmon Fair, MD or one of the following Advanced Practice Providers on your designated Care Team:    Azalee Course, PA-C  Micah Flesher, [place] or   Judy Pimple, PA-C       Signed, Thurmon Fair, MD  12/10/2020 10:04 AM    [facility]

## 2020-12-11 ENCOUNTER — Telehealth: Payer: Self-pay | Admitting: Cardiovascular Disease

## 2020-12-11 NOTE — Telephone Encounter (Signed)
Spoke with patient regarding the Tuesday 12/24/20 10:00 am CTA chest/aorta appointment at [facility] time is 9:30 am---1st floor radiology for check in---patient to come in Tuesday, Wednesday or Thursday of next week for lab work.  Patient voiced his understanding.

## 2020-12-11 NOTE — Telephone Encounter (Signed)
Left message for patient to call and discuss preferred scheduling weekdays and times for the CTA chest/aorta ordered by Dr. Royann Shivers

## 2020-12-18 DIAGNOSIS — Z01812 Encounter for preprocedural laboratory examination: Secondary | ICD-10-CM | POA: Diagnosis not present

## 2020-12-18 DIAGNOSIS — I712 Thoracic aortic aneurysm, without rupture: Secondary | ICD-10-CM | POA: Diagnosis not present

## 2020-12-18 LAB — BASIC METABOLIC PANEL
BUN/Creatinine Ratio: 17 (ref 10–24)
BUN: 20 mg/dL (ref 8–27)
CO2: 26 mmol/L (ref 20–29)
Calcium: 9.5 mg/dL (ref 8.6–10.2)
Chloride: 102 mmol/L (ref 96–106)
Creatinine, Ser: 1.21 mg/dL (ref 0.76–1.27)
Glucose: 82 mg/dL (ref 65–99)
Potassium: 4.3 mmol/L (ref 3.5–5.2)
Sodium: 141 mmol/L (ref 134–144)
eGFR: 64 mL/min/{1.73_m2} (ref >59)

## 2020-12-24 ENCOUNTER — Ambulatory Visit (HOSPITAL_COMMUNITY): Payer: Federal, State, Local not specified - PPO

## 2021-01-24 ENCOUNTER — Other Ambulatory Visit: Payer: Self-pay

## 2021-01-24 ENCOUNTER — Ambulatory Visit (HOSPITAL_COMMUNITY)
Admission: RE | Admit: 2021-01-24 | Discharge: 2021-01-24 | Disposition: A | Discharge status: Home / Self Care | Payer: Federal, State, Local not specified - PPO | Source: Ambulatory Visit | Attending: Cardiovascular Disease | Admitting: Cardiovascular Disease

## 2021-01-24 DIAGNOSIS — I712 Thoracic aortic aneurysm, without rupture, unspecified: Secondary | ICD-10-CM

## 2021-01-24 DIAGNOSIS — I517 Cardiomegaly: Secondary | ICD-10-CM | POA: Diagnosis not present

## 2021-01-24 LAB — POCT I-STAT CREATININE: Creatinine, Ser: 1.1 mg/dL (ref 0.61–1.24)

## 2021-01-24 MED ORDER — IOHEXOL 350 MG/ML SOLN
100.0000 mL | Freq: Once | INTRAVENOUS | Status: AC | PRN
  Administered 2021-01-24: 100 mL via INTRAVENOUS

## 2021-01-24 MED ORDER — SODIUM CHLORIDE (PF) 0.9 % IJ SOLN
INTRAMUSCULAR | Status: AC
  Filled 2021-01-24: qty 50

## 2021-02-07 ENCOUNTER — Other Ambulatory Visit: Payer: Self-pay | Admitting: *Deleted

## 2021-02-07 DIAGNOSIS — I712 Thoracic aortic aneurysm, without rupture, unspecified: Secondary | ICD-10-CM

## 2021-02-07 DIAGNOSIS — Z01818 Encounter for other preprocedural examination: Secondary | ICD-10-CM

## 2021-05-28 DIAGNOSIS — H2513 Age-related nuclear cataract, bilateral: Secondary | ICD-10-CM | POA: Diagnosis not present

## 2021-05-28 DIAGNOSIS — H40033 Anatomical narrow angle, bilateral: Secondary | ICD-10-CM | POA: Diagnosis not present

## 2021-05-28 DIAGNOSIS — H25013 Cortical age-related cataract, bilateral: Secondary | ICD-10-CM | POA: Diagnosis not present

## 2021-05-28 DIAGNOSIS — H40013 Open angle with borderline findings, low risk, bilateral: Secondary | ICD-10-CM | POA: Diagnosis not present

## 2021-09-10 DIAGNOSIS — H00022 Hordeolum internum right lower eyelid: Secondary | ICD-10-CM | POA: Diagnosis not present

## 2021-09-10 DIAGNOSIS — H0102A Squamous blepharitis right eye, upper and lower eyelids: Secondary | ICD-10-CM | POA: Diagnosis not present

## 2021-09-10 DIAGNOSIS — L719 Rosacea, unspecified: Secondary | ICD-10-CM | POA: Diagnosis not present

## 2021-09-10 DIAGNOSIS — H0102B Squamous blepharitis left eye, upper and lower eyelids: Secondary | ICD-10-CM | POA: Diagnosis not present

## 2021-10-01 DIAGNOSIS — L719 Rosacea, unspecified: Secondary | ICD-10-CM | POA: Diagnosis not present

## 2021-10-01 DIAGNOSIS — H00022 Hordeolum internum right lower eyelid: Secondary | ICD-10-CM | POA: Diagnosis not present

## 2021-10-01 DIAGNOSIS — H0102B Squamous blepharitis left eye, upper and lower eyelids: Secondary | ICD-10-CM | POA: Diagnosis not present

## 2021-10-01 DIAGNOSIS — H0102A Squamous blepharitis right eye, upper and lower eyelids: Secondary | ICD-10-CM | POA: Diagnosis not present

## 2021-10-14 DIAGNOSIS — N5201 Erectile dysfunction due to arterial insufficiency: Secondary | ICD-10-CM | POA: Diagnosis not present

## 2021-10-14 DIAGNOSIS — E785 Hyperlipidemia, unspecified: Secondary | ICD-10-CM | POA: Diagnosis not present

## 2021-10-14 DIAGNOSIS — Z79899 Other long term (current) drug therapy: Secondary | ICD-10-CM | POA: Diagnosis not present

## 2021-10-14 DIAGNOSIS — I2510 Atherosclerotic heart disease of native coronary artery without angina pectoris: Secondary | ICD-10-CM | POA: Diagnosis not present

## 2021-10-14 DIAGNOSIS — I7121 Aneurysm of the ascending aorta, without rupture: Secondary | ICD-10-CM | POA: Diagnosis not present

## 2021-10-14 DIAGNOSIS — E559 Vitamin D deficiency, unspecified: Secondary | ICD-10-CM | POA: Diagnosis not present

## 2021-10-14 DIAGNOSIS — Z0001 Encounter for general adult medical examination with abnormal findings: Secondary | ICD-10-CM | POA: Diagnosis not present

## 2021-10-14 DIAGNOSIS — I10 Essential (primary) hypertension: Secondary | ICD-10-CM | POA: Diagnosis not present

## 2022-01-06 ENCOUNTER — Other Ambulatory Visit (HOSPITAL_COMMUNITY): Payer: Federal, State, Local not specified - PPO

## 2022-01-13 ENCOUNTER — Ambulatory Visit: Payer: Federal, State, Local not specified - PPO | Admitting: Cardiovascular Disease

## 2022-01-19 ENCOUNTER — Ambulatory Visit (HOSPITAL_COMMUNITY)
Admission: RE | Admit: 2022-01-19 | Discharge: 2022-01-19 | Disposition: A | Discharge status: Home / Self Care | Payer: Federal, State, Local not specified - PPO | Source: Ambulatory Visit | Attending: Cardiovascular Disease | Admitting: Cardiovascular Disease

## 2022-01-19 DIAGNOSIS — J9811 Atelectasis: Secondary | ICD-10-CM | POA: Diagnosis not present

## 2022-01-19 DIAGNOSIS — I712 Thoracic aortic aneurysm, without rupture, unspecified: Secondary | ICD-10-CM | POA: Insufficient documentation

## 2022-01-19 MED ORDER — IOHEXOL 350 MG/ML SOLN
80.0000 mL | Freq: Once | INTRAVENOUS | Status: AC | PRN
  Administered 2022-01-19: 80 mL via INTRAVENOUS

## 2022-02-05 ENCOUNTER — Ambulatory Visit: Payer: Federal, State, Local not specified - PPO | Admitting: Cardiovascular Disease

## 2022-02-05 ENCOUNTER — Encounter: Payer: Self-pay | Admitting: Cardiovascular Disease

## 2022-02-05 VITALS — BP 126/86 | HR 55 | Ht 67.0 in | Wt 165.4 lb

## 2022-02-05 DIAGNOSIS — I7121 Aneurysm of the ascending aorta, without rupture: Secondary | ICD-10-CM | POA: Diagnosis not present

## 2022-02-05 DIAGNOSIS — I2584 Coronary atherosclerosis due to calcified coronary lesion: Secondary | ICD-10-CM

## 2022-02-05 DIAGNOSIS — I10 Essential (primary) hypertension: Secondary | ICD-10-CM

## 2022-02-05 DIAGNOSIS — I2510 Atherosclerotic heart disease of native coronary artery without angina pectoris: Secondary | ICD-10-CM

## 2022-02-05 DIAGNOSIS — E7800 Pure hypercholesterolemia, unspecified: Secondary | ICD-10-CM

## 2022-02-05 NOTE — Progress Notes (Signed)
Cardiology Office Note:    Date:  02/10/2022   ID:  TIMTHY NORDMANN, DOB 1945-12-17, MRN [id number]  PCP:  Josetta Huddle, MD  Cardiologist:  Sanda Klein, MD  Electrophysiologist:  None   Referring MD: Josetta Huddle, MD   Chief Complaint  Patient presents with   Thoracic Aortic Aneurysm     History of Present Illness:    ZABIEN CUTHBERTSON is a 76 y.o. male with a hx of hypertension and hypercholesterolemia, started on statin after he was found to have a calcium score of 536 (74th percentile for age and gender).  The CT also showed incidental finding of mild aneurysmal dilatation of the ascending aorta at 43 mm.  Both his paternal and maternal grandfather died in their late 59s from myocardial infarction.  His father had bypass surgery at age 41 but lived to be 74 years old.  He has a brother that is 5 years younger who has undergone cardiac catheterization and percutaneous intervention.  He is very active, doing a combination of walking and jogging 3 to 4 miles a day most days of the week.  He denies any problems with exertional angina or dyspnea.  His blood pressure is consistently in normal range at home, although it was a little high at 150/90 when he first checked in today (repeat was 126/86 and in March when he saw Dr. Inda Merlin it was 122/78).  His most recent lipid profile shows an LDL cholesterol of 64 and HDL of 63.  He does not have diabetes mellitus and does not smoke.  Follow-up CT angiogram performed 01/19/2022 shows an unchanged ascending thoracic aorta at 4.3 cm.  Incidental note of multiple hepatic cysts.  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 His first granddaughter was born in 2020 and he enjoys visiting her weekly in [place].  Past Medical History:  Diagnosis Date    Coronary artery disease    Hyperlipidemia    Hypertension     History reviewed. No pertinent surgical history.  Current Medications: Current Meds  Medication Sig   amLODipine (NORVASC) 5 MG tablet Take 5 mg by mouth daily.   atorvastatin (LIPITOR) 20 MG tablet Take 20 mg by mouth at bedtime.   Cetirizine HCl (ZYRTEC ALLERGY PO) Take 1 tablet by mouth daily as needed.   metoprolol succinate (TOPROL-XL) 25 MG 24 hr tablet Take 25 mg by mouth daily.   Multiple Vitamin (MULTIVITAMINS PO) Take by mouth daily.   valsartan (DIOVAN) 320 MG tablet Take 1 tablet (320 mg total) by mouth daily.     Allergies:   Patient has no allergy information on record.   Social History   Socioeconomic History   Marital status: Married    Spouse name: Not on file   Number of children: Not on file   Years of education: Not on file   Highest education level: Not on file  Occupational History   Not on file  Tobacco Use   Smoking status: Never   Smokeless tobacco: Never  Substance and Sexual Activity   Alcohol use: Not on file   Drug use: Never   Sexual activity: Not on file  Other Topics Concern   Not on file  Social History Narrative   Not on file   Social Determinants of Health   Financial Resource  Strain: Not on file  Food Insecurity: Not on file  Transportation Needs: Not on file  Physical Activity: Not on file  Stress: Not on file  Social Connections: Not on file     Family History: The patient's family history includes Heart attack in his maternal grandfather and paternal grandfather; Heart disease in his father.  ROS:   Please see the history of present illness.   All other systems are reviewed and are negative.   EKGs/Labs/Other Studies Reviewed:    The following studies were reviewed today: CTA Aorta 10/23/2019  EKG:  EKG is  ordered today.  It is a normal tracing, NSR.  Recent Labs: No results found for requested labs within last 365 days.  09/19/2019 Creatinine 1.0,  potassium 4.3, normal liver function tests, glucose 88, hemoglobin 13.6 10/08/2020 Creatinine 1.1, K 4.4, Hgb 13.7, TSH 0.67 10/14/2021 Hemoglobin 13.7, creatinine 1.1, potassium 4.5, ALT 22, TSH 0.70 Recent Lipid Panel No results found for: "CHOL", "TRIG", "HDL", "CHOLHDL", "VLDL", "LDLCALC", "LDLDIRECT" 09/19/2019  total cholesterol 136, triglycerides 84, HDL 56, LDL 63 10/08/2020 total cholesterol 144, triglycerides 45, HDL 67, LDL 67 10/14/2021 Total cholesterol 139, triglycerides 58, HDL 63, LDL 64 Physical Exam:    VS:  BP 126/86 (BP Location: Left Arm, Patient Position: Sitting, Cuff Size: Normal)   Pulse (!) 55   Ht 5\' 7"  (1.702 m)   Wt 165 lb 6.4 oz (75 kg)   SpO2 96%   BMI 25.91 kg/m     Wt Readings from Last 3 Encounters:  02/05/22 165 lb 6.4 oz (75 kg)  12/10/20 161 lb 9.6 oz (73.3 kg)  10/06/19 155 lb (70.3 kg)      General: Alert, oriented x3, no distress, appears fit and younger than stated age. Head: no evidence of trauma, PERRL, EOMI, no exophtalmos or lid lag, no myxedema, no xanthelasma; normal ears, nose and oropharynx Neck: normal jugular venous pulsations and no hepatojugular reflux; brisk carotid pulses without delay and no carotid bruits Chest: clear to auscultation, no signs of consolidation by percussion or palpation, normal fremitus, symmetrical and full respiratory excursions Cardiovascular: normal position and quality of the apical impulse, regular rhythm, normal first and second heart sounds, no murmurs, rubs or gallops Abdomen: no tenderness or distention, no masses by palpation, no abnormal pulsatility or arterial bruits, normal bowel sounds, no hepatosplenomegaly Extremities: no clubbing, cyanosis or edema; 2+ radial, ulnar and brachial pulses bilaterally; 2+ right femoral, posterior tibial and dorsalis pedis pulses; 2+ left femoral, posterior tibial and dorsalis pedis pulses; no subclavian or femoral bruits Neurological: grossly nonfocal Psych:  Normal mood and affect    ASSESSMENT:    1. Aneurysm of ascending aorta without rupture (HCC)   2. Essential hypertension   3. Hypercholesterolemia   4. Coronary artery calcification     PLAN:    In order of problems listed above:  Asc Ao Aneurysm: Stable in size, follow-up periodically with CT angiography. HTN: Well-controlled blood pressure. HLP: Target LDL less than 70 due to high calcium score.  LDL is within that range on current statin, continue. Coronary calcification: Excellent functional status, asymptomatic.  Medication Adjustments/Labs and Tests Ordered: Current medicines are reviewed at length with the patient today.  Concerns regarding medicines are outlined above.  Orders Placed This Encounter  Procedures   EKG 12-Lead   No orders of the defined types were placed in this encounter.   Patient Instructions  Medication Instructions:  No changes *If you need a refill on your cardiac  medications before your next appointment, please call your pharmacy*   Lab Work: None ordered If you have labs (blood work) drawn today and your tests are completely normal, you will receive your results only by: MyChart Message (if you have MyChart) OR A paper copy in the mail If you have any lab test that is abnormal or we need to change your treatment, we will call you to review the results.   Testing/Procedures: None ordered   Follow-Up: At [facility], you and your health needs are our priority.  As part of our continuing mission to provide you with exceptional heart care, we have created designated Provider Care Teams.  These Care Teams include your primary Cardiologist (physician) and Advanced Practice Providers (APPs -  Physician Assistants and Nurse Practitioners) who all work together to provide you with the care you need, when you need it.  We recommend signing up for the patient portal called "MyChart".  Sign up information is provided on this After Visit  Summary.  MyChart is used to connect with patients for Virtual Visits (Telemedicine).  Patients are able to view lab/test results, encounter notes, upcoming appointments, etc.  Non-urgent messages can be sent to your provider as well.   To learn more about what you can do with MyChart, go to [email or web address].    Your next appointment:   12 month(s)  The format for your next appointment:   In Person  Provider:   Thurmon Fair, MD {   Important Information About Sugar         Signed, Thurmon Fair, MD  02/10/2022 2:48 PM    [facility]

## 2022-02-05 NOTE — Patient Instructions (Signed)

## 2022-02-10 ENCOUNTER — Encounter: Payer: Self-pay | Admitting: Cardiovascular Disease

## 2022-04-21 DIAGNOSIS — R5383 Other fatigue: Secondary | ICD-10-CM | POA: Diagnosis not present

## 2022-04-21 DIAGNOSIS — R0981 Nasal congestion: Secondary | ICD-10-CM | POA: Diagnosis not present

## 2022-04-21 DIAGNOSIS — R519 Headache, unspecified: Secondary | ICD-10-CM | POA: Diagnosis not present

## 2022-06-04 DIAGNOSIS — H40033 Anatomical narrow angle, bilateral: Secondary | ICD-10-CM | POA: Diagnosis not present

## 2022-06-04 DIAGNOSIS — H11153 Pinguecula, bilateral: Secondary | ICD-10-CM | POA: Diagnosis not present

## 2022-06-04 DIAGNOSIS — H524 Presbyopia: Secondary | ICD-10-CM | POA: Diagnosis not present

## 2022-06-04 DIAGNOSIS — H25813 Combined forms of age-related cataract, bilateral: Secondary | ICD-10-CM | POA: Diagnosis not present

## 2022-06-04 DIAGNOSIS — H40013 Open angle with borderline findings, low risk, bilateral: Secondary | ICD-10-CM | POA: Diagnosis not present

## 2022-07-30 DIAGNOSIS — H40011 Open angle with borderline findings, low risk, right eye: Secondary | ICD-10-CM | POA: Diagnosis not present

## 2022-08-27 DIAGNOSIS — H40013 Open angle with borderline findings, low risk, bilateral: Secondary | ICD-10-CM | POA: Diagnosis not present

## 2022-11-18 DIAGNOSIS — R1013 Epigastric pain: Secondary | ICD-10-CM | POA: Diagnosis not present

## 2022-11-18 DIAGNOSIS — R5383 Other fatigue: Secondary | ICD-10-CM | POA: Diagnosis not present

## 2022-11-18 DIAGNOSIS — R197 Diarrhea, unspecified: Secondary | ICD-10-CM | POA: Diagnosis not present

## 2022-11-20 DIAGNOSIS — R197 Diarrhea, unspecified: Secondary | ICD-10-CM | POA: Diagnosis not present

## 2022-12-21 DIAGNOSIS — H40013 Open angle with borderline findings, low risk, bilateral: Secondary | ICD-10-CM | POA: Diagnosis not present

## 2022-12-21 DIAGNOSIS — H40033 Anatomical narrow angle, bilateral: Secondary | ICD-10-CM | POA: Diagnosis not present

## 2023-01-18 DIAGNOSIS — E559 Vitamin D deficiency, unspecified: Secondary | ICD-10-CM | POA: Diagnosis not present

## 2023-01-18 DIAGNOSIS — I10 Essential (primary) hypertension: Secondary | ICD-10-CM | POA: Diagnosis not present

## 2023-01-18 DIAGNOSIS — Z0000 Encounter for general adult medical examination without abnormal findings: Secondary | ICD-10-CM | POA: Diagnosis not present

## 2023-01-18 DIAGNOSIS — Z79899 Other long term (current) drug therapy: Secondary | ICD-10-CM | POA: Diagnosis not present

## 2023-01-18 DIAGNOSIS — E785 Hyperlipidemia, unspecified: Secondary | ICD-10-CM | POA: Diagnosis not present

## 2023-03-10 ENCOUNTER — Ambulatory Visit: Payer: Federal, State, Local not specified - PPO | Attending: Cardiovascular Disease

## 2023-03-10 ENCOUNTER — Encounter: Payer: Self-pay | Admitting: Cardiovascular Disease

## 2023-03-10 ENCOUNTER — Ambulatory Visit: Payer: Federal, State, Local not specified - PPO | Attending: Cardiovascular Disease | Admitting: Cardiovascular Disease

## 2023-03-10 VITALS — BP 128/78 | HR 77 | Ht 67.0 in | Wt 161.2 lb

## 2023-03-10 DIAGNOSIS — I4891 Unspecified atrial fibrillation: Secondary | ICD-10-CM

## 2023-03-10 DIAGNOSIS — Z01812 Encounter for preprocedural laboratory examination: Secondary | ICD-10-CM | POA: Diagnosis not present

## 2023-03-10 DIAGNOSIS — I7121 Aneurysm of the ascending aorta, without rupture: Secondary | ICD-10-CM

## 2023-03-10 DIAGNOSIS — I10 Essential (primary) hypertension: Secondary | ICD-10-CM

## 2023-03-10 DIAGNOSIS — I2510 Atherosclerotic heart disease of native coronary artery without angina pectoris: Secondary | ICD-10-CM

## 2023-03-10 DIAGNOSIS — E7800 Pure hypercholesterolemia, unspecified: Secondary | ICD-10-CM

## 2023-03-10 DIAGNOSIS — I2584 Coronary atherosclerosis due to calcified coronary lesion: Secondary | ICD-10-CM

## 2023-03-10 MED ORDER — APIXABAN 5 MG PO TABS: 5.0000 mg | ORAL_TABLET | Freq: Two times a day (BID) | ORAL | 3 refills | Status: DC

## 2023-03-10 NOTE — Progress Notes (Signed)
Cardiology Office Note:    Date:  03/10/2023   ID:  David Munoz, DOB October 29, 1945, MRN [id number]  PCP:  David Noble, MD (Inactive)  Cardiologist:  David Fair, MD  Electrophysiologist:  None   Referring MD: No ref. provider found   No chief complaint on file.    History of Present Illness:    David Munoz is a 77 y.o. male with a hx of hypertension and hypercholesterolemia, started on statin after he was found to have a calcium score of 536 (74th percentile for age and gender).  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  He presents today for follow-up and was incidentally discovered to be in atrial fibrillation with controlled ventricular rate.  He is completely unaware of the arrhythmia.  He remains very active, he walks or jogs 3 to 4 miles a day with his Advertising account planner.  He has not had any problems with reduction in stamina, angina, dyspnea, dizziness or palpitations.  He is planning to relocate to [place] around the beginning of next year.  Both his paternal and maternal grandfather died in their late 92s from myocardial infarction.  His father had bypass surgery at age 49 but lived to be 75 years old.  He has a brother that is 5 years younger who has undergone cardiac catheterization and percutaneous intervention.  Follow-up CT angiogram performed 01/19/2022 shows an unchanged ascending thoracic aorta at 4.3 cm.  Incidental note of multiple hepatic cysts.  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 His first granddaughter was born in 2020 and he enjoys visiting her weekly in [place].  Past Medical History:  Diagnosis Date   Coronary artery disease     Hyperlipidemia    Hypertension     No past surgical history on file.  Current Medications: Current Meds  Medication Sig   amLODipine (NORVASC) 5 MG tablet Take 5 mg by mouth daily.   atorvastatin (LIPITOR) 20 MG tablet Take 20 mg by mouth at bedtime.   metoprolol succinate (TOPROL-XL) 25 MG 24 hr tablet Take 25 mg by mouth daily.   Multiple Vitamin (MULTIVITAMINS PO) Take by mouth daily.   sildenafil (REVATIO) 20 MG tablet Take by mouth as needed.   valsartan (DIOVAN) 320 MG tablet Take 1 tablet (320 mg total) by mouth daily.   [DISCONTINUED] apixaban (ELIQUIS) 5 MG TABS tablet Take 1 tablet (5 mg total) by mouth 2 (two) times daily.     Allergies:   Patient has no allergy information on record.   Social History   Socioeconomic History   Marital status: Married    Spouse name: Not on file   Number of children: Not on file   Years of education: Not on file   Highest education level: Not on file  Occupational History   Not on file  Tobacco Use   Smoking status: Never   Smokeless tobacco: Never  Substance and Sexual Activity   Alcohol use: Not on file   Drug use: Never   Sexual activity: Not on file  Other Topics Concern   Not on file  Social History Narrative   Not on file   Social Determinants of Health   Financial Resource Strain: Not  on file  Food Insecurity: Not on file  Transportation Needs: Not on file  Physical Activity: Not on file  Stress: Not on file  Social Connections: Not on file     Family History: The patient's family history includes Heart attack in his maternal grandfather and paternal grandfather; Heart disease in his father.  ROS:   Please see the history of present illness.   All other systems are reviewed and are negative.   EKGs/Labs/Other Studies Reviewed:    The following studies were reviewed today: CTA Aorta 10/23/2019  EKG:  EKG is  ordered today.  Shows atrial fibrillation with a ventricular rate of 77 bpm there is 1  wide-complex beat that is probably aberrant conduction.  Recent Labs: No results found for requested labs within last 365 days.  09/19/2019 Creatinine 1.0, potassium 4.3, normal liver function tests, glucose 88, hemoglobin 13.6 10/08/2020 Creatinine 1.1, K 4.4, Hgb 13.7, TSH 0.67 10/14/2021 Hemoglobin 13.7, creatinine 1.1, potassium 4.5, ALT 22, TSH 0.70 01/18/2023 Hemoglobin 14.5, creatinine 1.21, potassium 4.2, ALT 20 Recent Lipid Panel No results found for: "CHOL", "TRIG", "HDL", "CHOLHDL", "VLDL", "LDLCALC", "LDLDIRECT" 09/19/2019  total cholesterol 136, triglycerides 84, HDL 56, LDL 63 10/08/2020 total cholesterol 144, triglycerides 45, HDL 67, LDL 67 10/14/2021 Total cholesterol 139, triglycerides 58, HDL 63, LDL 64 01/18/2023 Total cholesterol 141, triglycerides 67, HDL 66, LDL 62 Physical Exam:    VS:  BP 128/78 (BP Location: Left Arm, Patient Position: Sitting, Cuff Size: Normal)   Pulse 77   Ht 5\' 7"  (1.702 m)   Wt 161 lb 3.2 oz (73.1 kg)   SpO2 97%   BMI 25.25 kg/m     Wt Readings from Last 3 Encounters:  03/10/23 161 lb 3.2 oz (73.1 kg)  02/05/22 165 lb 6.4 oz (75 kg)  12/10/20 161 lb 9.6 oz (73.3 kg)     General: Alert, oriented x3, no distress, appears fit and lean, younger than stated age Head: no evidence of trauma, PERRL, EOMI, no exophtalmos or lid lag, no myxedema, no xanthelasma; normal ears, nose and oropharynx Neck: normal jugular venous pulsations and no hepatojugular reflux; brisk carotid pulses without delay and no carotid bruits Chest: clear to auscultation, no signs of consolidation by percussion or palpation, normal fremitus, symmetrical and full respiratory excursions Cardiovascular: normal position and quality of the apical impulse, irregular rhythm, normal first and second heart sounds, no murmurs, rubs or gallops Abdomen: no tenderness or distention, no masses by palpation, no abnormal pulsatility or arterial bruits, normal bowel sounds, no  hepatosplenomegaly Extremities: no clubbing, cyanosis or edema; 2+ radial, ulnar and brachial pulses bilaterally; 2+ right femoral, posterior tibial and dorsalis pedis pulses; 2+ left femoral, posterior tibial and dorsalis pedis pulses; no subclavian or femoral bruits Neurological: grossly nonfocal Psych: Normal mood and affect   ASSESSMENT:    1. Atrial fibrillation, unspecified type (HCC)   2. Aneurysm of ascending aorta without rupture (HCC)   3. Primary hypertension   4. Hypercholesterolemia   5. Coronary artery calcification   6. Pre-procedural laboratory examination     PLAN:    In order of problems listed above:  Afib: Incidentally discovered today and asymptomatic.  Rate controlled on a low-dose of metoprolol which she was taking for hypertension.  CHA2DS2-VASc score at least 3, possibly 4 (age 72, HTN, plus minus coronary calcifications).  Discussed the pros and cons of anticoagulation for stroke prevention and started him on Eliquis 5 mg twice daily.  Will check an echocardiogram.  Will  check a 7-day monitor to see if he has paroxysmal versus persistent atrial fibrillation.  Antiarrhythmic therapy with either medications or ablation does not appear to be indicated at this time.  If he is in persistent atrial fibrillation we could consider cardioversion after 3 weeks of anticoagulation.   Asc Ao Aneurysm: Stable in size, will check a follow-up CT angiogram. HTN: Well-controlled. HLP: Target LDL less than 70 due to high calcium score.  Although lipid parameters are within target range. Coronary calcification: Excellent functional status, asymptomatic.  The focus is on risk factor modification.  Medication Adjustments/Labs and Tests Ordered: Current medicines are reviewed at length with the patient today.  Concerns regarding medicines are outlined above.  Orders Placed This Encounter  Procedures   CT ANGIO CHEST AORTA W/CM & OR WO/CM   Basic metabolic panel   [place] TERM MONITOR  (3-14 DAYS)   EKG 12-Lead   ECHOCARDIOGRAM COMPLETE   Meds ordered this encounter  Medications   DISCONTD: apixaban (ELIQUIS) 5 MG TABS tablet    Sig: Take 1 tablet (5 mg total) by mouth 2 (two) times daily.    Dispense:  180 tablet    Refill:  3   apixaban (ELIQUIS) 5 MG TABS tablet    Sig: Take 1 tablet (5 mg total) by mouth 2 (two) times daily.    Dispense:  180 tablet    Refill:  3    Patient Instructions  Medication Instructions:  Eliquis 5 mg twice a day *If you need a refill on your cardiac medications before your next appointment, please call your pharmacy*   Lab Work: BMP- today If you have labs (blood work) drawn today and your tests are completely normal, you will receive your results only by: MyChart Message (if you have MyChart) OR A paper copy in the mail If you have any lab test that is abnormal or we need to change your treatment, we will call you to review the results.   Testing/Procedures: Your physician has requested that you have an echocardiogram. Echocardiography is a painless test that uses sound waves to create images of your heart. It provides your doctor with information about the size and shape of your heart and how well your heart's chambers and valves are working. This procedure takes approximately one hour. There are no restrictions for this procedure. Please do NOT wear cologne, perfume, aftershave, or lotions (deodorant is allowed). Please arrive 15 minutes prior to your appointment time.   CT-scan of the chest   Your physician has recommended that you wear a 7 DAY ZIO-PATCH monitor. The Zio patch cardiac monitor continuously records heart rhythm data for up to 14 days, this is for patients being evaluated for multiple types heart rhythms. For the first 24 hours post application, please avoid getting the Zio monitor wet in the shower or by excessive sweating during exercise. After that, feel free to carry on with regular activities. Keep soaps and  lotions away from the ZIO XT Patch.  This will be mailed to you, please expect 7-10 days to receive.    Applying the monitor   Shave hair from upper left chest.   Hold abrader disc by orange tab.  Rub abrader in 40 strokes over left upper chest as indicated in your monitor instructions.   Clean area with 4 enclosed alcohol pads .  Use all pads to assure are is cleaned thoroughly.  Let dry.   Apply patch as indicated in monitor instructions.  Patch will be place  under collarbone on left side of chest with arrow pointing upward.   Rub patch adhesive wings for 2 minutes.Remove white label marked "1".  Remove white label marked "2".  Rub patch adhesive wings for 2 additional minutes.   While looking in a mirror, press and release button in center of patch.  A small green light will flash 3-4 times .  This will be your only indicator the monitor has been turned on.     Do not shower for the first 24 hours.  You may shower after the first 24 hours.   Press button if you feel a symptom. You will hear a small click.  Record Date, Time and Symptom in the Patient Log Book.   When you are ready to remove patch, follow instructions on last 2 pages of Patient Log Book.  Stick patch monitor onto last page of Patient Log Book.   Place Patient Log Book in [place] box.  Use locking tab on box and tape box closed securely.  The Orange and [employer] has [employer] on it.  Please place in mailbox as soon as possible.  Your physician should have your test results approximately 7 days after the monitor has been mailed back to [facility].   Call [facility] Customer Care at [phone] if you have questions regarding your ZIO XT patch monitor.  Call them immediately if you see an orange light blinking on your monitor.   If your monitor falls off in less than 4 days contact our Monitor department at [phone].  If your monitor becomes loose or falls off after 4 days call [employer] at [phone]  for suggestions on securing your monitor    Follow-Up: At [facility], you and your health needs are our priority.  As part of our continuing mission to provide you with exceptional heart care, we have created designated Provider Care Teams.  These Care Teams include your primary Cardiologist (physician) and Advanced Practice Providers (APPs -  Physician Assistants and Nurse Practitioners) who all work together to provide you with the care you need, when you need it.  We recommend signing up for the patient portal called "MyChart".  Sign up information is provided on this After Visit Summary.  MyChart is used to connect with patients for Virtual Visits (Telemedicine).  Patients are able to view lab/test results, encounter notes, upcoming appointments, etc.  Non-urgent messages can be sent to your provider as well.   To learn more about what you can do with MyChart, go to [email or web address].    Your next appointment:   6 month(s)  Provider:   Thurmon Fair, MD       Signed, David Fair, MD  03/10/2023 11:55 AM    [facility]

## 2023-03-10 NOTE — Progress Notes (Unsigned)
Enrolled for [employer] to mail a ZIO XT long term holter monitor to the patients address on file.  

## 2023-03-10 NOTE — Patient Instructions (Signed)
Medication Instructions:  Eliquis 5 mg twice a day *If you need a refill on your cardiac medications before your next appointment, please call your pharmacy*   Lab Work: BMP- today If you have labs (blood work) drawn today and your tests are completely normal, you will receive your results only by: MyChart Message (if you have MyChart) OR A paper copy in the mail If you have any lab test that is abnormal or we need to change your treatment, we will call you to review the results.   Testing/Procedures: Your physician has requested that you have an echocardiogram. Echocardiography is a painless test that uses sound waves to create images of your heart. It provides your doctor with information about the size and shape of your heart and how well your heart's chambers and valves are working. This procedure takes approximately one hour. There are no restrictions for this procedure. Please do NOT wear cologne, perfume, aftershave, or lotions (deodorant is allowed). Please arrive 15 minutes prior to your appointment time.   CT-scan of the chest   Your physician has recommended that you wear a 7 DAY ZIO-PATCH monitor. The Zio patch cardiac monitor continuously records heart rhythm data for up to 14 days, this is for patients being evaluated for multiple types heart rhythms. For the first 24 hours post application, please avoid getting the Zio monitor wet in the shower or by excessive sweating during exercise. After that, feel free to carry on with regular activities. Keep soaps and lotions away from the ZIO XT Patch.  This will be mailed to you, please expect 7-10 days to receive.    Applying the monitor   Shave hair from upper left chest.   Hold abrader disc by orange tab.  Rub abrader in 40 strokes over left upper chest as indicated in your monitor instructions.   Clean area with 4 enclosed alcohol pads .  Use all pads to assure are is cleaned thoroughly.  Let dry.   Apply patch as indicated  in monitor instructions.  Patch will be place under collarbone on left side of chest with arrow pointing upward.   Rub patch adhesive wings for 2 minutes.Remove white label marked "1".  Remove white label marked "2".  Rub patch adhesive wings for 2 additional minutes.   While looking in a mirror, press and release button in center of patch.  A small green light will flash 3-4 times .  This will be your only indicator the monitor has been turned on.     Do not shower for the first 24 hours.  You may shower after the first 24 hours.   Press button if you feel a symptom. You will hear a small click.  Record Date, Time and Symptom in the Patient Log Book.   When you are ready to remove patch, follow instructions on last 2 pages of Patient Log Book.  Stick patch monitor onto last page of Patient Log Book.   Place Patient Log Book in [place] box.  Use locking tab on box and tape box closed securely.  The [employer] has [employer] on it.  Please place in mailbox as soon as possible.  Your physician should have your test results approximately 7 days after the monitor has been mailed back to [facility].   Call [facility] Customer Care at [phone] if you have questions regarding your ZIO XT patch monitor.  Call them immediately if you see an orange light blinking on your monitor.  If your monitor falls off in less than 4 days contact our Monitor department at [phone].  If your monitor becomes loose or falls off after 4 days call [employer] at [phone] for suggestions on securing your monitor    Follow-Up: At [facility], you and your health needs are our priority.  As part of our continuing mission to provide you with exceptional heart care, we have created designated Provider Care Teams.  These Care Teams include your primary Cardiologist (physician) and Advanced Practice Providers (APPs -  Physician Assistants and Nurse Practitioners) who all work together to  provide you with the care you need, when you need it.  We recommend signing up for the patient portal called "MyChart".  Sign up information is provided on this After Visit Summary.  MyChart is used to connect with patients for Virtual Visits (Telemedicine).  Patients are able to view lab/test results, encounter notes, upcoming appointments, etc.  Non-urgent messages can be sent to your provider as well.   To learn more about what you can do with MyChart, go to [email or web address].    Your next appointment:   6 month(s)  Provider:   Thurmon Fair, MD

## 2023-03-15 DIAGNOSIS — I4891 Unspecified atrial fibrillation: Secondary | ICD-10-CM | POA: Diagnosis not present

## 2023-03-30 ENCOUNTER — Ambulatory Visit
Admission: RE | Admit: 2023-03-30 | Discharge: 2023-03-30 | Disposition: A | Discharge status: Home / Self Care | Payer: Federal, State, Local not specified - PPO | Source: Ambulatory Visit | Attending: Cardiovascular Disease | Admitting: Cardiovascular Disease

## 2023-03-30 DIAGNOSIS — Z09 Encounter for follow-up examination after completed treatment for conditions other than malignant neoplasm: Secondary | ICD-10-CM | POA: Diagnosis not present

## 2023-03-30 DIAGNOSIS — I7121 Aneurysm of the ascending aorta, without rupture: Secondary | ICD-10-CM | POA: Diagnosis not present

## 2023-03-30 MED ORDER — IOPAMIDOL (ISOVUE-370) INJECTION 76%
200.0000 mL | Freq: Once | INTRAVENOUS | Status: AC | PRN
  Administered 2023-03-30: 75 mL via INTRAVENOUS

## 2023-04-01 ENCOUNTER — Ambulatory Visit (HOSPITAL_COMMUNITY): Payer: Federal, State, Local not specified - PPO | Attending: Cardiovascular Disease

## 2023-04-01 ENCOUNTER — Encounter: Payer: Self-pay | Admitting: Cardiovascular Disease

## 2023-04-01 DIAGNOSIS — I4891 Unspecified atrial fibrillation: Secondary | ICD-10-CM

## 2023-04-01 DIAGNOSIS — Z01812 Encounter for preprocedural laboratory examination: Secondary | ICD-10-CM

## 2023-04-01 LAB — ECHOCARDIOGRAM COMPLETE
Area-P 1/2: 4.5 cm2
S' Lateral: 2.7 cm

## 2023-04-02 NOTE — Telephone Encounter (Signed)
Please schedule for DCCV on September 13 for AFib.

## 2023-04-02 NOTE — Telephone Encounter (Signed)
Called to inform of cardioversion scheduled and instructions. Left a message.  All of this information is also in a MyChart message.

## 2023-04-13 ENCOUNTER — Encounter: Payer: Self-pay | Admitting: Cardiovascular Disease

## 2023-04-15 ENCOUNTER — Other Ambulatory Visit: Payer: Self-pay

## 2023-04-15 DIAGNOSIS — I4891 Unspecified atrial fibrillation: Secondary | ICD-10-CM

## 2023-04-15 DIAGNOSIS — Z01812 Encounter for preprocedural laboratory examination: Secondary | ICD-10-CM | POA: Diagnosis not present

## 2023-04-15 LAB — CBC
Hematocrit: 44.1 % (ref 37.5–51.0)
Hemoglobin: 14.5 g/dL (ref 13.0–17.7)
MCH: 30.5 pg (ref 26.6–33.0)
MCHC: 32.9 g/dL (ref 31.5–35.7)
MCV: 93 fL (ref 79–97)
Platelets: 257 10*3/uL (ref 150–450)
RBC: 4.76 x10E6/uL (ref 4.14–5.80)
RDW: 13.1 % (ref 11.6–15.4)
WBC: 5.8 10*3/uL (ref 3.4–10.8)

## 2023-04-16 ENCOUNTER — Ambulatory Visit: Payer: Federal, State, Local not specified - PPO | Admitting: Cardiovascular Disease

## 2023-04-16 LAB — BASIC METABOLIC PANEL
BUN/Creatinine Ratio: 16 (ref 10–24)
BUN: 21 mg/dL (ref 8–27)
CO2: 26 mmol/L (ref 20–29)
Calcium: 10.3 mg/dL — ABNORMAL HIGH (ref 8.6–10.2)
Chloride: 103 mmol/L (ref 96–106)
Creatinine, Ser: 1.28 mg/dL — ABNORMAL HIGH (ref 0.76–1.27)
Glucose: 88 mg/dL (ref 70–99)
Potassium: 5.3 mmol/L — ABNORMAL HIGH (ref 3.5–5.2)
Sodium: 141 mmol/L (ref 134–144)
eGFR: 58 mL/min/{1.73_m2} — ABNORMAL LOW (ref >59)

## 2023-04-22 NOTE — Progress Notes (Signed)
Unable to reach patient about procedure, but was able to leave a detailed message. Stated that the patient needed to arrive at the hospital at 0900, remain NPO after 0000, needs to have a ride home and a responsible adult to stay with them for 24 hours after the procedure. Instructed the patient to call back if they had any questions.

## 2023-04-23 ENCOUNTER — Ambulatory Visit (HOSPITAL_COMMUNITY): Payer: Federal, State, Local not specified - PPO | Admitting: Anesthesiology

## 2023-04-23 ENCOUNTER — Other Ambulatory Visit: Payer: Self-pay

## 2023-04-23 ENCOUNTER — Encounter (HOSPITAL_COMMUNITY)
Admission: RE | Disposition: A | Discharge status: Home / Self Care | Payer: Self-pay | Source: Home / Self Care | Attending: Cardiovascular Disease

## 2023-04-23 ENCOUNTER — Ambulatory Visit (HOSPITAL_COMMUNITY)
Admission: RE | Admit: 2023-04-23 | Discharge: 2023-04-23 | Disposition: A | Discharge status: Home / Self Care | Payer: Federal, State, Local not specified - PPO | Attending: Cardiovascular Disease | Admitting: Cardiovascular Disease

## 2023-04-23 DIAGNOSIS — I2510 Atherosclerotic heart disease of native coronary artery without angina pectoris: Secondary | ICD-10-CM | POA: Diagnosis not present

## 2023-04-23 DIAGNOSIS — I4819 Other persistent atrial fibrillation: Secondary | ICD-10-CM | POA: Diagnosis not present

## 2023-04-23 DIAGNOSIS — I4891 Unspecified atrial fibrillation: Secondary | ICD-10-CM

## 2023-04-23 DIAGNOSIS — E785 Hyperlipidemia, unspecified: Secondary | ICD-10-CM | POA: Diagnosis not present

## 2023-04-23 DIAGNOSIS — I10 Essential (primary) hypertension: Secondary | ICD-10-CM | POA: Insufficient documentation

## 2023-04-23 HISTORY — PX: CARDIOVERSION: SHX1299

## 2023-04-23 SURGERY — CARDIOVERSION
Anesthesia: Monitor Anesthesia Care

## 2023-04-23 MED ORDER — SODIUM CHLORIDE 0.9 % IV SOLN: INTRAVENOUS | Status: DC

## 2023-04-23 MED ORDER — PROPOFOL 10 MG/ML IV BOLUS
INTRAVENOUS | Status: DC | PRN
  Administered 2023-04-23: 100 mg via INTRAVENOUS

## 2023-04-23 SURGICAL SUPPLY — 1 items: ELECT DEFIB PAD ADLT CADENCE (PAD) ×1 IMPLANT

## 2023-04-23 NOTE — Anesthesia Preprocedure Evaluation (Addendum)
Anesthesia Evaluation  Patient identified by MRN, date of birth, ID band Patient awake    Reviewed: Allergy & Precautions, NPO status , Patient's Chart, lab work & pertinent test results  Airway Mallampati: II  TM Distance: >3 FB Neck ROM: Full    Dental no notable dental hx.    Pulmonary neg pulmonary ROS   Pulmonary exam normal        Cardiovascular hypertension, Pt. on medications and Pt. on home beta blockers + CAD  Normal cardiovascular exam+ dysrhythmias Atrial Fibrillation   ECHO: 1. Left ventricular ejection fraction, by estimation, is 60 to 65%. The  left ventricle has normal function. The left ventricle has no regional  wall motion abnormalities. There is mild concentric left ventricular  hypertrophy. Left ventricular diastolic  parameters are indeterminate.   2. Right ventricular systolic function is normal. The right ventricular  size is mildly enlarged.   3. Left atrial size was severely dilated.   4. Right atrial size was severely dilated.   5. The mitral valve is normal in structure. Mild to moderate mitral valve  regurgitation. No evidence of mitral stenosis.   6. The aortic valve is normal in structure. Aortic valve regurgitation is  not visualized. Aortic valve sclerosis is present, with no evidence of  aortic valve stenosis.   7. There is mild dilatation of the aortic root, measuring 39 mm. There is  moderate dilatation of the ascending aorta, measuring 44 mm.   8. The inferior vena cava is normal in size with greater than 50%  respiratory variability, suggesting right atrial pressure of 3 mmHg.     Neuro/Psych negative neurological ROS  negative psych ROS   GI/Hepatic negative GI ROS, Neg liver ROS,,,  Endo/Other  negative endocrine ROS    Renal/GU Renal disease     Musculoskeletal negative musculoskeletal ROS (+)    Abdominal   Peds  Hematology  (+) Blood dyscrasia (Eliquis)   Anesthesia  Other Findings A-fib  Reproductive/Obstetrics                             Anesthesia Physical Anesthesia Plan  ASA: 3  Anesthesia Plan: MAC   Post-op Pain Management:    Induction:   PONV Risk Score and Plan: 1 and Propofol infusion and Treatment may vary due to age or medical condition  Airway Management Planned: Simple Face Mask  Additional Equipment:   Intra-op Plan:   Post-operative Plan:   Informed Consent: I have reviewed the patients History and Physical, chart, labs and discussed the procedure including the risks, benefits and alternatives for the proposed anesthesia with the patient or authorized representative who has indicated his/her understanding and acceptance.     Dental advisory given  Plan Discussed with: CRNA  Anesthesia Plan Comments:         Anesthesia Quick Evaluation

## 2023-04-23 NOTE — Anesthesia Postprocedure Evaluation (Signed)
Anesthesia Post Note  Patient: David Munoz  Procedure(s) Performed: CARDIOVERSION     Patient location during evaluation: Cath Lab Anesthesia Type: MAC Level of consciousness: awake Pain management: pain level controlled Vital Signs Assessment: post-procedure vital signs reviewed and stable Respiratory status: spontaneous breathing, nonlabored ventilation and respiratory function stable Cardiovascular status: blood pressure returned to baseline and stable Postop Assessment: no apparent nausea or vomiting Anesthetic complications: no   No notable events documented.  Last Vitals:  Vitals:   04/23/23 1025 04/23/23 1030  BP: (!) 126/104 (!) 139/100  Pulse: 63 62  Resp: 14 13  Temp:    SpO2: 97% 98%    Last Pain:  Vitals:   04/23/23 1030  TempSrc:   PainSc: 0-No pain                 Edker Punt P Pooja Camuso

## 2023-04-23 NOTE — H&P (Signed)
Cardiology Admission History and Physical   Patient ID: David Munoz MRN: [id number]; DOB: 06-Feb-1946   Admission date: 04/23/2023  PCP:  Emilio Aspen, MD   [facility] Providers Cardiologist:  Thurmon Fair, MD        Chief Complaint:  AFib  Patient Profile:   David Munoz is a 77 y.o. male with persistent atrial fibrillation  who is being seen 04/23/2023 for elective cardioversion.  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 He has LVH and severe biatrial dilation, with normal LV function and no valvular abnormalities by echo.   Past Medical History:  Diagnosis Date   Coronary artery disease    Hyperlipidemia    Hypertension     No past surgical history on file.   Medications Prior to Admission: Prior to Admission medications   Medication Sig Start Date End Date Taking? Authorizing Provider  amLODipine (NORVASC) 5 MG tablet Take 5 mg by mouth daily. 07/27/19  Yes [provider]  apixaban (ELIQUIS) 5 MG TABS tablet Take 1 tablet (5 mg total) by mouth 2 (two) times daily. 03/10/23  Yes Erickson Yamashiro, MD  atorvastatin (LIPITOR) 20 MG tablet Take 20 mg by mouth at bedtime. 07/27/19  Yes [provider]  metoprolol succinate (TOPROL-XL) 25 MG 24 hr tablet Take 25 mg by mouth daily. 08/01/19  Yes [provider]  Multiple Vitamin (MULTIVITAMINS PO) Take 1 tablet by mouth daily.   Yes [provider]  valsartan (DIOVAN) 320 MG tablet Take 1 tablet (320 mg total) by mouth daily. 12/10/20  Yes Kaleab Frasier, Rachelle Hora, MD     Allergies:   No Known Allergies  Social History:   Social History   Socioeconomic History   Marital status: Married    Spouse name: Not on file   Number of children: Not on file   Years of education: Not on file   Highest  education level: Not on file  Occupational History   Not on file  Tobacco Use   Smoking status: Never   Smokeless tobacco: Never  Substance and Sexual Activity   Alcohol use: Not on file   Drug use: Never   Sexual activity: Not on file  Other Topics Concern   Not on file  Social History Narrative   Not on file   Social Determinants of Health   Financial Resource Strain: Not on file  Food Insecurity: Not on file  Transportation Needs: Not on file  Physical Activity: Not on file  Stress: Not on file  Social Connections: Not on file  Intimate Partner Violence: Not on file    Family History:   The patient's family history includes Heart attack in his maternal grandfather and paternal grandfather; Heart disease in his father.    ROS:  Please see the history of present illness.  All other ROS reviewed and negative.     Physical Exam/Data:   Vitals:   04/23/23 0903 04/23/23 0907  BP: (!) 147/91 (!) 147/91  Pulse: 86 85  Resp: 20 16  Temp: 97.9 F (36.6 C)   SpO2: 98% 93%  Weight: 72.6 kg   Height: 5\' 7"  (1.702 m)    No intake or output data in the 24 hours ending 04/23/23 0912    04/23/2023    9:03 AM 03/10/2023    8:28  AM 02/05/2022    9:56 AM  Last 3 Weights  Weight (lbs) 160 lb 161 lb 3.2 oz 165 lb 6.4 oz  Weight (kg) 72.576 kg 73.12 kg 75.025 kg     Body mass index is 25.06 kg/m.  General:  Well nourished, well developed, in no acute distress HEENT: normal Neck: no JVD Vascular: No carotid bruits; Distal pulses 2+ bilaterally   Cardiac:  normal S1, S2; irregular; no murmur  Lungs:  clear to auscultation bilaterally, no wheezing, rhonchi or rales  Abd: soft, nontender, no hepatomegaly  Ext: no edema Musculoskeletal:  No deformities, BUE and BLE strength normal and equal Skin: warm and dry  Neuro:  CNs 2-12 intact, no focal abnormalities noted Psych:  Normal affect    EKG:  The ECG that was done 03/10/2023 was personally reviewed and demonstrates atrial  fibrillation   Relevant CV Studies: 03/26/2023 Monitor  The dominant rhythm is atrial fibrillation with good ventricular rate control and normal circadian variation.  At times, there is more organized atrial flutter with predominant 4: 1 AV block and a regular rate.   A single pause lasting 3 seconds occurred at night.   Occasional premature ventricular contractions are seen, representing 1.6% of all recorded beats, but there is no ventricular tachycardia.   A single symptom driven recording is submitted for analysis and shows no particular change in the rhythm.  At that time the patient appeared to have atrial flutter with 4: 1 AV block.   Persistent atrial fibrillation with good ventricular rate control.  04/01/2023 Echo   1. Left ventricular ejection fraction, by estimation, is 60 to 65%. The  left ventricle has normal function. The left ventricle has no regional  wall motion abnormalities. There is mild concentric left ventricular  hypertrophy. Left ventricular diastolic  parameters are indeterminate.   2. Right ventricular systolic function is normal. The right ventricular  size is mildly enlarged.   3. Left atrial size was severely dilated.   4. Right atrial size was severely dilated.   5. The mitral valve is normal in structure. Mild to moderate mitral valve  regurgitation. No evidence of mitral stenosis.   6. The aortic valve is normal in structure. Aortic valve regurgitation is  not visualized. Aortic valve sclerosis is present, with no evidence of  aortic valve stenosis.   7. There is mild dilatation of the aortic root, measuring 39 mm. There is  moderate dilatation of the ascending aorta, measuring 44 mm.   8. The inferior vena cava is normal in size with greater than 50%  respiratory variability, suggesting right atrial pressure of 3 mmHg.   Laboratory Data:  High Sensitivity Troponin:  No results for input(s): "TROPONINIHS" in the last 720 hours.    ChemistryNo results  for input(s): "NA", "K", "CL", "CO2", "GLUCOSE", "BUN", "CREATININE", "CALCIUM", "MG", "GFRNONAA", "GFRAA", "ANIONGAP" in the last 168 hours.  No results for input(s): "PROT", "ALBUMIN", "AST", "ALT", "ALKPHOS", "BILITOT" in the last 168 hours. Lipids No results for input(s): "CHOL", "TRIG", "HDL", "LABVLDL", "LDLCALC", "CHOLHDL" in the last 168 hours. HematologyNo results for input(s): "WBC", "RBC", "HGB", "HCT", "MCV", "MCH", "MCHC", "RDW", "PLT" in the last 168 hours. Thyroid No results for input(s): "TSH", "FREET4" in the last 168 hours. BNPNo results for input(s): "BNP", "PROBNP" in the last 168 hours.  DDimer No results for input(s): "DDIMER" in the last 168 hours.   Radiology/Studies:  No results found.   Assessment and Plan:   Afib: rate controled and symptomatic, but  of recent onset. Try DCCV. Risk of ERAF with biatrial dilation, but consider antiarrhythmic approach (meds vs ablation) if there is a noticeable change in symptoms/functional status. Biatrial dilation: amyloidosis considered, but annular diastolic velocities look pretty normal (in AF). HTN:  controlled Asc Ao Aneurysm: mild - needs annual re-eval. CAD: asymptomatic, normal LVEF. Focus on risk factor modification. Target LDL<70.   Risk Assessment/Risk Scores:         CHA2DS2-VASc Score = 4   This indicates a 4.8% annual risk of stroke. The patient's score is based upon: CHF History: 0 HTN History: 1 Diabetes History: 0 Stroke History: 0 Vascular Disease History: 1 Age Score: 2 Gender Score: 0     Code Status: Full Code    For questions or updates, please contact [facility] Please consult [email or web address] for contact info under     Signed, Thurmon Fair, MD  04/23/2023 9:12 AM

## 2023-04-23 NOTE — Op Note (Signed)
Procedure: Electrical Cardioversion Indications:  Atrial Fibrillation  Procedure Details:  Consent: Risks of procedure as well as the alternatives and risks of each were explained to the (patient/caregiver).  Consent for procedure obtained.  Time Out: Verified patient identification, verified procedure, site/side was marked, verified correct patient position, special equipment/implants available, medications/allergies/relevent history reviewed, required imaging and test results available.  Performed  Patient placed on cardiac monitor, pulse oximetry, supplemental oxygen as necessary.  Sedation given:  propofol 100 mg IV Pacer pads placed anterior and posterior chest.  Cardioverted 1 time(s).  Cardioversion with synchronized biphasic 120J shock.  Evaluation: Findings: Post procedure EKG shows: NSR Complications: None Patient did tolerate procedure well.  Time Spent Directly with the Patient:  30 minutes   David Munoz 04/23/2023, 10:11 AM

## 2023-04-23 NOTE — Transfer of Care (Signed)
Immediate Anesthesia Transfer of Care Note  Patient: Barbette Or  Procedure(s) Performed: CARDIOVERSION  Patient Location: PACU  Anesthesia Type:MAC  Level of Consciousness: awake, alert , oriented, and patient cooperative  Airway & Oxygen Therapy: Patient Spontanous Breathing  Post-op Assessment: Report given to RN and Post -op Vital signs reviewed and stable  Post vital signs: Reviewed and stable  Last Vitals:  Vitals Value Taken Time  BP    Temp    Pulse 78 04/23/23 1001  Resp 15 04/23/23 1001  SpO2 100 % 04/23/23 1001  Vitals shown include unfiled device data.  Last Pain:  Vitals:   04/23/23 0907  PainSc: 0-No pain         Complications: No notable events documented.

## 2023-04-25 ENCOUNTER — Encounter: Payer: Self-pay | Admitting: Cardiovascular Disease

## 2023-04-26 ENCOUNTER — Encounter (HOSPITAL_COMMUNITY): Payer: Self-pay | Admitting: Cardiovascular Disease

## 2023-04-26 NOTE — Telephone Encounter (Signed)
Informed pt that Dr C would like to see him in 3-4 months. Also, that he would be more than happy to refer to EP for ablation. Appointment made for 07/26/23 with Dr Royann Shivers.  The patient would like to talk to Dr Royann Shivers about ablation and especially about the new type of ablation that is not available at [facility] yet. Informed pt that I would send this information to the provider and he will be in touch soon.

## 2023-04-26 NOTE — Telephone Encounter (Signed)
Yes, back in AFib very quickly after DCCV. Talked to Jonny Ruiz. For now we both agree that we will pursue conservative management with rate control. Keep December appt please.

## 2023-05-31 ENCOUNTER — Encounter: Payer: Self-pay | Admitting: Cardiovascular Disease

## 2023-05-31 DIAGNOSIS — I4891 Unspecified atrial fibrillation: Secondary | ICD-10-CM

## 2023-05-31 NOTE — Telephone Encounter (Signed)
Please put in referral to EP for ablation of atrial fibrillation

## 2023-06-11 DIAGNOSIS — H25813 Combined forms of age-related cataract, bilateral: Secondary | ICD-10-CM | POA: Diagnosis not present

## 2023-06-11 DIAGNOSIS — H40013 Open angle with borderline findings, low risk, bilateral: Secondary | ICD-10-CM | POA: Diagnosis not present

## 2023-06-11 DIAGNOSIS — H524 Presbyopia: Secondary | ICD-10-CM | POA: Diagnosis not present

## 2023-06-11 DIAGNOSIS — H11153 Pinguecula, bilateral: Secondary | ICD-10-CM | POA: Diagnosis not present

## 2023-07-01 ENCOUNTER — Encounter: Payer: Self-pay | Admitting: Cardiovascular Disease

## 2023-07-01 ENCOUNTER — Ambulatory Visit: Payer: Federal, State, Local not specified - PPO | Attending: Cardiovascular Disease | Admitting: Cardiovascular Disease

## 2023-07-01 VITALS — BP 120/86 | HR 88 | Ht 67.0 in | Wt 161.0 lb

## 2023-07-01 DIAGNOSIS — I4891 Unspecified atrial fibrillation: Secondary | ICD-10-CM | POA: Diagnosis not present

## 2023-07-01 NOTE — Patient Instructions (Signed)
Medication Instructions:  Your physician recommends that you continue on your current medications as directed. Please refer to the Current Medication list given to you today. *If you need a refill on your cardiac medications before your next appointment, please call your pharmacy*  Testing/Procedures: Atrial Fibrillation Ablation - We will contact you to set this up when Dr [facility] hospital schedule is available - we have also added you to his wait list for any openings that may come up Your physician has recommended that you have an ablation. Catheter ablation is a medical procedure used to treat some cardiac arrhythmias (irregular heartbeats). During catheter ablation, a Vierra, thin, flexible tube is put into a blood vessel in your groin (upper thigh), or neck. This tube is called an ablation catheter. It is then guided to your heart through the blood vessel. Radio frequency waves destroy small areas of heart tissue where abnormal heartbeats may cause an arrhythmia to start. Please see the instruction sheet given to you today.   Follow-Up: At [facility], you and your health needs are our priority.  As part of our continuing mission to provide you with exceptional heart care, we have created designated Provider Care Teams.  These Care Teams include your primary Cardiologist (physician) and Advanced Practice Providers (APPs -  Physician Assistants and Nurse Practitioners) who all work together to provide you with the care you need, when you need it.  We recommend signing up for the patient portal called "MyChart".  Sign up information is provided on this After Visit Summary.  MyChart is used to connect with patients for Virtual Visits (Telemedicine).  Patients are able to view lab/test results, encounter notes, upcoming appointments, etc.  Non-urgent messages can be sent to your provider as well.   To learn more about what you can do with MyChart, go to [email or web address].    Your next  appointment:   We will schedule a follow up appointment prior to ablation once scheduled   Provider:   York Pellant, MD

## 2023-07-01 NOTE — Progress Notes (Signed)
Electrophysiology Office Note:    Date:  07/01/2023   ID:  David Munoz, DOB 10/24/1945, MRN [id number]  PCP:  Emilio Aspen, MD   [facility] Providers Cardiologist:  Thurmon Fair, MD     Referring MD: Thurmon Fair, MD   History of Present Illness:    David Munoz is a 77 y.o. male with a medical history significant for persistent atrial fibrillation, hypertension positive coronary calcium, referred for management of atrial fibrillation .     I discussed the use of AI scribe software for clinical note transcription with the patient, who gave verbal consent to proceed.  The patient, with a history of atrial fibrillation diagnosed on July 31st, presents for discussion of treatment options. The diagnosis was incidental during a cardiology visit for follow-up of a stable aortic aneurysm. They report a decrease in aerobic capacity, particularly noticeable during exercise. They describe an episode of tachycardia during a recent workout, where their heart rate exceeded 145 bpm, leading them to stop exercising. They also report new-onset insomnia, which may be related to their atrial fibrillation. They deny any symptoms of peripheral neuropathy.  He underwent DC cardioversion on September 13.  He has since had recurrence of atrial fibrillation     Today, he reports that he is at baseline.  At rest he feels fine but his exertional capacity is limited  EKGs/Labs/Other Studies Reviewed Today:     Echocardiogram:  Transthoracic echocardiogram April 01, 2023 LVEF 60 to 65% with mild concentric LVH.  Severe bilateral atrial dilation.    EKG:   EKG Interpretation Date/Time:  Thursday July 01 2023 11:55:10 EST Ventricular Rate:  88 PR Interval:    QRS Duration:  76 QT Interval:  362 QTC Calculation: 438 R Axis:   29  Text Interpretation: Atrial fibrillation with premature ventricular or aberrantly conducted complexes When compared with ECG of 23-Apr-2023  10:10, Atrial fibrillation has replaced Sinus rhythm Confirmed by York Pellant [phone]) on 07/01/2023 12:00:21 PM     Physical Exam:    VS:  BP 120/86 (BP Location: Left Arm, Patient Position: Sitting, Cuff Size: Normal)   Pulse 88   Ht 5\' 7"  (1.702 m)   Wt 161 lb (73 kg)   SpO2 99%   BMI 25.22 kg/m     Wt Readings from Last 3 Encounters:  07/01/23 161 lb (73 kg)  04/23/23 160 lb (72.6 kg)  03/10/23 161 lb 3.2 oz (73.1 kg)     GEN: Well nourished, well developed in no acute distress CARDIAC: iRRR, no murmurs, rubs, gallops RESPIRATORY:  Normal work of breathing MUSCULOSKELETAL: no edema    ASSESSMENT & PLAN:     Persistent atrial fibrillation Symptomatic with reduced exercise capacity Has had recurrence after DC cardioversion April 23, 2023 We discussed management options he would like to undergo atrial fibrillation ablation with pulsed field technology He is currently preparing to move to [facility] with a planned move date sometime in February likely I am currently booked up past his move date, but we may build to work him in at a sooner time if there is a cancellation  We discussed the indication, rationale, logistics, anticipated benefits, and potential risks of the ablation procedure including but not limited to -- bleed at the groin access site, chest pain, damage to nearby organs such as the diaphragm, lungs, or esophagus, need for a drainage tube, or prolonged hospitalization. I explained that the risk for stroke, heart attack, need for open chest  surgery, or even death is very low but not zero. he  expressed understanding and wishes to proceed.   Secondary hypercoagulable state Continue apixaban 5 mg twice daily    Signed, [name], MD  07/01/2023 12:00 PM    [facility]

## 2023-07-02 ENCOUNTER — Encounter: Payer: Self-pay | Admitting: Cardiovascular Disease

## 2023-07-14 ENCOUNTER — Institutional Professional Consult (permissible substitution): Payer: Federal, State, Local not specified - PPO | Admitting: Cardiovascular Disease

## 2023-07-26 ENCOUNTER — Encounter: Payer: Self-pay | Admitting: Cardiovascular Disease

## 2023-07-26 ENCOUNTER — Ambulatory Visit: Payer: Federal, State, Local not specified - PPO | Attending: Cardiovascular Disease | Admitting: Cardiovascular Disease

## 2023-07-26 VITALS — BP 120/78 | HR 98 | Ht 67.0 in | Wt 163.0 lb

## 2023-07-26 DIAGNOSIS — I4891 Unspecified atrial fibrillation: Secondary | ICD-10-CM | POA: Diagnosis not present

## 2023-07-26 DIAGNOSIS — R931 Abnormal findings on diagnostic imaging of heart and coronary circulation: Secondary | ICD-10-CM | POA: Diagnosis not present

## 2023-07-26 DIAGNOSIS — I10 Essential (primary) hypertension: Secondary | ICD-10-CM | POA: Diagnosis not present

## 2023-07-26 DIAGNOSIS — E7800 Pure hypercholesterolemia, unspecified: Secondary | ICD-10-CM | POA: Diagnosis not present

## 2023-07-26 DIAGNOSIS — I4819 Other persistent atrial fibrillation: Secondary | ICD-10-CM

## 2023-07-26 NOTE — Patient Instructions (Signed)
Medication Instructions:  No changes *If you need a refill on your cardiac medications before your next appointment, please call your pharmacy*   Follow-Up: At [facility], you and your health needs are our priority.  As part of our continuing mission to provide you with exceptional heart care, we have created designated Provider Care Teams.  These Care Teams include your primary Cardiologist (physician) and Advanced Practice Providers (APPs -  Physician Assistants and Nurse Practitioners) who all work together to provide you with the care you need, when you need it.  We recommend signing up for the patient portal called "MyChart".  Sign up information is provided on this After Visit Summary.  MyChart is used to connect with patients for Virtual Visits (Telemedicine).  Patients are able to view lab/test results, encounter notes, upcoming appointments, etc.  Non-urgent messages can be sent to your provider as well.   To learn more about what you can do with MyChart, go to [email or web address].    Your next appointment:   6 month(s)  Provider:   Mihai Croitoru, MD     

## 2023-07-26 NOTE — Progress Notes (Signed)
Cardiology Office Note:    Date:  07/30/2023   ID:  David Munoz, DOB 1946/06/19, MRN [id number]  PCP:  Emilio Aspen, MD  Cardiologist:  Thurmon Fair, MD  Electrophysiologist:  None   Referring MD: Emilio Aspen, *   Chief Complaint  Patient presents with   Atrial Fibrillation     History of Present Illness:    David Munoz is a 77 y.o. male with a hx of hypertension and hypercholesterolemia, started on statin after he was found to have a calcium score of 536 (74th percentile for age and gender).  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  He presents today for follow-up and was incidentally discovered to be in atrial fibrillation with controlled ventricular rate.  He is completely unaware of the arrhythmia.  He remains very active, he walks or jogs 3 to 4 miles a day with his Advertising account planner and goes to the gym at [employer] twice a week, without complaints of exertional angina or dyspnea.  He does feel that his exercise ability is limited ever since he had atrial fibrillation.  He remains unaware of the palpitations.  He has not experienced syncope or dizziness.  He has not had any falls or any bleeding problems on anticoagulation with Eliquis.  His blood pressure remains very well-controlled.  His typical heart rate at home is around 88-90 bpm at rest.  He has normal left ventricular systolic function, mild LVH and severe biatrial dilation on echo.  He had early recurrence of atrial fibrillation after cardioversion last September.  He is currently planned for pulsed field ablation for the atrial fibrillation sometime in February with Dr. Nelly Laurence.  He is selling his house in [place] and will be moving into [place] area around January 30.  Both his paternal and maternal grandfather died in their late 6s from myocardial infarction.  His father had bypass  surgery at age 31 but lived to be 75 years old.  He has a brother that is 5 years younger who has undergone cardiac catheterization and percutaneous intervention.  Follow-up CT angiogram performed 01/19/2022 shows an unchanged ascending thoracic aorta at 4.3 cm.  Incidental note of multiple hepatic cysts.  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 His first granddaughter was born in 2020 and he enjoys visiting her weekly in [place].  Past Medical History:  Diagnosis Date   A-fib [facility])    Coronary artery disease    Hyperlipidemia    Hypertension     Past Surgical History:  Procedure Laterality Date   CARDIOVERSION N/A 04/23/2023   Procedure: CARDIOVERSION;  Surgeon: Thurmon Fair, MD;  Location: [facility] INVASIVE CV LAB;  Service: Cardiovascular;  Laterality: N/A;    Current Medications: Current Meds  Medication Sig   amLODipine (NORVASC) 5 MG tablet Take 5 mg by mouth daily.   apixaban (ELIQUIS) 5 MG TABS tablet Take 1 tablet (5 mg total) by mouth 2 (two) times daily.   atorvastatin (LIPITOR) 20 MG tablet Take 20 mg by mouth at bedtime.   metoprolol succinate (TOPROL-XL) 25 MG 24 hr tablet Take 25 mg by mouth daily.   Multiple Vitamin (MULTIVITAMINS PO) Take 1 tablet by mouth daily.  valsartan (DIOVAN) 320 MG tablet Take 1 tablet (320 mg total) by mouth daily.     Allergies:   Patient has no known allergies.   Social History   Socioeconomic History   Marital status: Married    Spouse name: Not on file   Number of children: Not on file   Years of education: Not on file   Highest education level: Not on file  Occupational History   Not on file  Tobacco Use   Smoking status: Never   Smokeless tobacco: Never  Substance and Sexual Activity   Alcohol use: Not on file    Drug use: Never   Sexual activity: Not on file  Other Topics Concern   Not on file  Social History Narrative   Not on file   Social Drivers of Health   Financial Resource Strain: Not on file  Food Insecurity: Not on file  Transportation Needs: Not on file  Physical Activity: Not on file  Stress: Not on file  Social Connections: Not on file     Family History: The patient's family history includes Heart attack in his maternal grandfather and paternal grandfather; Heart disease in his father.  ROS:   Please see the history of present illness.   All other systems are reviewed and are negative.   EKGs/Labs/Other Studies Reviewed:    The following studies were reviewed today: CTA Aorta 10/23/2019  EKG:    EKG Interpretation Date/Time:  Monday July 26 2023 14:42:44 EST Ventricular Rate:  98 PR Interval:    QRS Duration:  88 QT Interval:  330 QTC Calculation: 421 R Axis:   4  Text Interpretation: Atrial fibrillation with premature ventricular or aberrantly conducted complexes Nonspecific ST abnormality When compared with ECG of 01-Jul-2023 11:55, No significant change was found Confirmed by Avey Mcmanamon [phone]) on 07/26/2023 3:13:00 PM         Recent Labs: 04/15/2023: BUN 21; Creatinine, Ser 1.28; Hemoglobin 14.5; Platelets 257; Potassium 5.3; Sodium 141  09/19/2019 Creatinine 1.0, potassium 4.3, normal liver function tests, glucose 88, hemoglobin 13.6 10/08/2020 Creatinine 1.1, K 4.4, Hgb 13.7, TSH 0.67 10/14/2021 Hemoglobin 13.7, creatinine 1.1, potassium 4.5, ALT 22, TSH 0.70 01/18/2023 Hemoglobin 14.5, creatinine 1.21, potassium 4.2, ALT 20 Recent Lipid Panel No results found for: "CHOL", "TRIG", "HDL", "CHOLHDL", "VLDL", "LDLCALC", "LDLDIRECT" 09/19/2019  total cholesterol 136, triglycerides 84, HDL 56, LDL 63 10/08/2020 total cholesterol 144, triglycerides 45, HDL 67, LDL 67 10/14/2021 Total cholesterol 139, triglycerides 58, HDL 63, LDL  64 01/18/2023 Total cholesterol 141, triglycerides 67, HDL 66, LDL 62 Physical Exam:    VS:  BP 120/78   Pulse 98   Ht 5\' 7"  (1.702 m)   Wt 163 lb (73.9 kg)   SpO2 98%   BMI 25.53 kg/m     Wt Readings from Last 3 Encounters:  07/26/23 163 lb (73.9 kg)  07/01/23 161 lb (73 kg)  04/23/23 160 lb (72.6 kg)     General: Alert, oriented x3, no distress, appears fit and lean, younger than stated age Head: no evidence of trauma, PERRL, EOMI, no exophtalmos or lid lag, no myxedema, no xanthelasma; normal ears, nose and oropharynx Neck: normal jugular venous pulsations and no hepatojugular reflux; brisk carotid pulses without delay and no carotid bruits Chest: clear to auscultation, no signs of consolidation by percussion or palpation, normal fremitus, symmetrical and full respiratory excursions Cardiovascular: normal position and quality of the apical impulse, irregular rhythm, normal first and second heart sounds, no murmurs, rubs  or gallops Abdomen: no tenderness or distention, no masses by palpation, no abnormal pulsatility or arterial bruits, normal bowel sounds, no hepatosplenomegaly Extremities: no clubbing, cyanosis or edema; 2+ radial, ulnar and brachial pulses bilaterally; 2+ right femoral, posterior tibial and dorsalis pedis pulses; 2+ left femoral, posterior tibial and dorsalis pedis pulses; no subclavian or femoral bruits Neurological: grossly nonfocal Psych: Normal mood and affect   ASSESSMENT:    1. Persistent atrial fibrillation (HCC)   2. Primary hypertension   3. Hypercholesterolemia   4. Elevated coronary artery calcium score     PLAN:    In order of problems listed above:  Afib: Remains asymptomatic and appropriately anticoagulated and well rate controlled.  Early recurrence after cardioversion.  CHA2DS2-VASc score at least 3, possibly 4 (age 52, HTN, plus minus coronary calcifications).  Plan for ablation with Dr. Nelly Laurence in February.  Asc Ao Aneurysm: Stable in  size, will check a follow-up CT angiogram. HTN: Well-controlled. HLP: Has achieved Target LDL less than 70, indicated due to high calcium score.  All the lipid parameters are excellent on the current statin regimen.  Continue Coronary calcification: Excellent functional status, asymptomatic.  The focus is on risk factor modification.  Medication Adjustments/Labs and Tests Ordered: Current medicines are reviewed at length with the patient today.  Concerns regarding medicines are outlined above.  Orders Placed This Encounter  Procedures   EKG 12-Lead   No orders of the defined types were placed in this encounter.   Patient Instructions  Medication Instructions:  No changes *If you need a refill on your cardiac medications before your next appointment, please call your pharmacy*  Follow-Up: At [facility], you and your health needs are our priority.  As part of our continuing mission to provide you with exceptional heart care, we have created designated Provider Care Teams.  These Care Teams include your primary Cardiologist (physician) and Advanced Practice Providers (APPs -  Physician Assistants and Nurse Practitioners) who all work together to provide you with the care you need, when you need it.  We recommend signing up for the patient portal called "MyChart".  Sign up information is provided on this After Visit Summary.  MyChart is used to connect with patients for Virtual Visits (Telemedicine).  Patients are able to view lab/test results, encounter notes, upcoming appointments, etc.  Non-urgent messages can be sent to your provider as well.   To learn more about what you can do with MyChart, go to [email or web address].    Your next appointment:   6 month(s)  Provider:   Thurmon Fair, MD       Signed, Thurmon Fair, MD  07/30/2023 8:45 PM    [facility]

## 2023-07-28 ENCOUNTER — Telehealth: Payer: Self-pay

## 2023-07-28 DIAGNOSIS — I4891 Unspecified atrial fibrillation: Secondary | ICD-10-CM

## 2023-07-28 NOTE — Telephone Encounter (Signed)
Spoke with patient, ablation scheduled for 09/27/23 at 1230 with Dr Nelly Laurence - labs to be completed on 08/30/23. No questions at this time

## 2023-07-30 ENCOUNTER — Encounter: Payer: Self-pay | Admitting: Cardiovascular Disease

## 2023-08-31 ENCOUNTER — Encounter: Payer: Self-pay | Admitting: Cardiovascular Disease

## 2023-08-31 LAB — CBC
Hematocrit: 45.3 % (ref 37.5–51.0)
Hemoglobin: 15.3 g/dL (ref 13.0–17.7)
MCH: 31 pg (ref 26.6–33.0)
MCHC: 33.8 g/dL (ref 31.5–35.7)
MCV: 92 fL (ref 79–97)
Platelets: 263 10*3/uL (ref 150–450)
RBC: 4.94 x10E6/uL (ref 4.14–5.80)
RDW: 12.9 % (ref 11.6–15.4)
WBC: 5.5 10*3/uL (ref 3.4–10.8)

## 2023-08-31 NOTE — Telephone Encounter (Signed)
Spoke with patient, scheduled for a cardiac CT on 09/06/23 and will not need someone to drive him to this appt.

## 2023-09-01 ENCOUNTER — Encounter: Payer: Self-pay | Admitting: Cardiovascular Disease

## 2023-09-01 LAB — BASIC METABOLIC PANEL
BUN/Creatinine Ratio: 19 (ref 10–24)
BUN: 23 mg/dL (ref 8–27)
CO2: 24 mmol/L (ref 20–29)
Calcium: 9.6 mg/dL (ref 8.6–10.2)
Chloride: 102 mmol/L (ref 96–106)
Creatinine, Ser: 1.21 mg/dL (ref 0.76–1.27)
Glucose: 80 mg/dL (ref 70–99)
Potassium: 4.1 mmol/L (ref 3.5–5.2)
Sodium: 141 mmol/L (ref 134–144)
eGFR: 62 mL/min/{1.73_m2} (ref >59)

## 2023-09-06 ENCOUNTER — Ambulatory Visit (HOSPITAL_COMMUNITY)
Admission: RE | Admit: 2023-09-06 | Discharge: 2023-09-06 | Disposition: A | Discharge status: Home / Self Care | Payer: Federal, State, Local not specified - PPO | Source: Ambulatory Visit | Attending: Cardiovascular Disease | Admitting: Cardiovascular Disease

## 2023-09-06 ENCOUNTER — Encounter: Payer: Self-pay | Admitting: Cardiovascular Disease

## 2023-09-06 DIAGNOSIS — I4891 Unspecified atrial fibrillation: Secondary | ICD-10-CM | POA: Diagnosis not present

## 2023-09-06 MED ORDER — IOHEXOL 350 MG/ML SOLN
95.0000 mL | Freq: Once | INTRAVENOUS | Status: AC | PRN
  Administered 2023-09-06: 95 mL via INTRAVENOUS

## 2023-09-20 ENCOUNTER — Telehealth (HOSPITAL_COMMUNITY): Payer: Self-pay

## 2023-09-20 NOTE — Telephone Encounter (Signed)
 Call placed to patient to discuss upcoming procedure.   CT: completed and acceptable.  Labs: completed and acceptable.   Any recent signs of acute illness or been started on antibiotics? NO Diabetic medications to hold?  NO Any missed doses of blood thinner?  NO Advised patient to continue taking ANTICOAGULANT: Eliquis  (Apixaban ) without missing any doses. Hold all medications on the morning of procedure, including Eliquis  or the procedure may be rescheduled.  Confirmed patient is scheduled for Atrial Fibrillation Ablation on Monday, February 17 with Dr. Spence Dux. Instructed patient to arrive at the Main Entrance A at [facility]: [address] [place], [place] [address] and check in at Admitting at 5:30 AM.   Advised of plan to go home the same day and will only stay overnight if medically necessary. You MUST have a responsible adult to drive you home and MUST be with you the first 24 hours after you arrive home or your procedure could be cancelled.  Patient verbalized understanding to all instructions provided and agreed to proceed with procedure.

## 2023-09-24 NOTE — Pre-Procedure Instructions (Signed)
Attempted to call patient regarding procedure instructions.  Left voicemail on  the following items: Arrival time 0515 Nothing to eat or drink after midnight No meds AM of procedure Responsible person to drive you home and stay with you for 24 hrs  Have you missed any doses of anti-coagulant Eliquis- should be taken twice a day, please let us know if you have missed any doses.  Don't take dose on Monday morning.

## 2023-09-27 ENCOUNTER — Ambulatory Visit (HOSPITAL_COMMUNITY): Payer: Federal, State, Local not specified - PPO | Admitting: Certified Registered Nurse Anesthetist

## 2023-09-27 ENCOUNTER — Encounter (HOSPITAL_COMMUNITY)
Admission: RE | Disposition: A | Discharge status: Home / Self Care | Payer: Federal, State, Local not specified - PPO | Source: Home / Self Care | Attending: Cardiovascular Disease

## 2023-09-27 ENCOUNTER — Ambulatory Visit (HOSPITAL_COMMUNITY)
Admission: RE | Admit: 2023-09-27 | Discharge: 2023-09-27 | Disposition: A | Discharge status: Home / Self Care | Payer: Federal, State, Local not specified - PPO | Attending: Cardiovascular Disease | Admitting: Cardiovascular Disease

## 2023-09-27 ENCOUNTER — Encounter (HOSPITAL_COMMUNITY): Payer: Self-pay | Admitting: Cardiovascular Disease

## 2023-09-27 ENCOUNTER — Other Ambulatory Visit: Payer: Self-pay

## 2023-09-27 DIAGNOSIS — D6869 Other thrombophilia: Secondary | ICD-10-CM | POA: Diagnosis not present

## 2023-09-27 DIAGNOSIS — I4819 Other persistent atrial fibrillation: Secondary | ICD-10-CM | POA: Insufficient documentation

## 2023-09-27 DIAGNOSIS — Z7901 Long term (current) use of anticoagulants: Secondary | ICD-10-CM | POA: Insufficient documentation

## 2023-09-27 HISTORY — PX: ATRIAL FIBRILLATION ABLATION: EP1191

## 2023-09-27 LAB — POCT ACTIVATED CLOTTING TIME: Activated Clotting Time: 262 s

## 2023-09-27 SURGERY — ATRIAL FIBRILLATION ABLATION
Anesthesia: General

## 2023-09-27 MED ORDER — LIDOCAINE 2% (20 MG/ML) 5 ML SYRINGE
INTRAMUSCULAR | Status: DC | PRN
  Administered 2023-09-27: 2.5 mL via INTRAVENOUS

## 2023-09-27 MED ORDER — SUGAMMADEX SODIUM 200 MG/2ML IV SOLN
INTRAVENOUS | Status: DC | PRN
  Administered 2023-09-27: 100 mg via INTRAVENOUS
  Administered 2023-09-27: 190 mg via INTRAVENOUS

## 2023-09-27 MED ORDER — ACETAMINOPHEN 325 MG PO TABS: 650.0000 mg | ORAL_TABLET | ORAL | Status: DC | PRN

## 2023-09-27 MED ORDER — HEPARIN (PORCINE) IN NACL 1000-0.9 UT/500ML-% IV SOLN
INTRAVENOUS | Status: DC | PRN
  Administered 2023-09-27 (×3): 500 mL

## 2023-09-27 MED ORDER — FENTANYL CITRATE (PF) 100 MCG/2ML IJ SOLN
INTRAMUSCULAR | Status: AC
  Filled 2023-09-27: qty 2

## 2023-09-27 MED ORDER — ONDANSETRON HCL 4 MG/2ML IJ SOLN: 4.0000 mg | Freq: Four times a day (QID) | INTRAMUSCULAR | Status: DC | PRN

## 2023-09-27 MED ORDER — PROTAMINE SULFATE 10 MG/ML IV SOLN
INTRAVENOUS | Status: DC | PRN
  Administered 2023-09-27: 50 mg via INTRAVENOUS

## 2023-09-27 MED ORDER — DEXAMETHASONE SODIUM PHOSPHATE 10 MG/ML IJ SOLN
INTRAMUSCULAR | Status: DC | PRN
  Administered 2023-09-27: 10 mg via INTRAVENOUS

## 2023-09-27 MED ORDER — PROPOFOL 10 MG/ML IV BOLUS
INTRAVENOUS | Status: DC | PRN
  Administered 2023-09-27: 150 mg via INTRAVENOUS

## 2023-09-27 MED ORDER — LACTATED RINGERS IV SOLN
INTRAVENOUS | Status: DC | PRN
Start: 2023-09-27 — End: 2023-09-27

## 2023-09-27 MED ORDER — SODIUM CHLORIDE 0.9 % IV SOLN: INTRAVENOUS | Status: DC

## 2023-09-27 MED ORDER — ATROPINE SULFATE 1 MG/ML IV SOLN
INTRAVENOUS | Status: DC | PRN
  Administered 2023-09-27: 1 mg via INTRAVENOUS

## 2023-09-27 MED ORDER — ROCURONIUM BROMIDE 10 MG/ML (PF) SYRINGE
PREFILLED_SYRINGE | INTRAVENOUS | Status: DC | PRN
  Administered 2023-09-27: 60 mg via INTRAVENOUS

## 2023-09-27 MED ORDER — PHENYLEPHRINE 80 MCG/ML (10ML) SYRINGE FOR IV PUSH (FOR BLOOD PRESSURE SUPPORT)
PREFILLED_SYRINGE | INTRAVENOUS | Status: DC | PRN
  Administered 2023-09-27: 80 ug via INTRAVENOUS
  Administered 2023-09-27: 160 ug via INTRAVENOUS

## 2023-09-27 MED ORDER — ONDANSETRON HCL 4 MG/2ML IJ SOLN
INTRAMUSCULAR | Status: DC | PRN
  Administered 2023-09-27: 4 mg via INTRAVENOUS

## 2023-09-27 MED ORDER — HEPARIN SODIUM (PORCINE) 1000 UNIT/ML IJ SOLN
INTRAMUSCULAR | Status: DC | PRN
  Administered 2023-09-27: 12000 [IU] via INTRAVENOUS
  Administered 2023-09-27: 3000 [IU] via INTRAVENOUS

## 2023-09-27 MED ORDER — ATROPINE SULFATE 1 MG/10ML IJ SOSY
PREFILLED_SYRINGE | INTRAMUSCULAR | Status: AC
  Filled 2023-09-27: qty 10

## 2023-09-27 MED ORDER — FENTANYL CITRATE (PF) 250 MCG/5ML IJ SOLN
INTRAMUSCULAR | Status: DC | PRN
  Administered 2023-09-27: 100 ug via INTRAVENOUS

## 2023-09-27 SURGICAL SUPPLY — 20 items
BAG SNAP BAND KOVER 36X36 (MISCELLANEOUS) IMPLANT
CABLE PFA RX CATH CONN (CABLE) IMPLANT
CATH FARAWAVE ABLATION 31 (CATHETERS) IMPLANT
CATH OCTARAY 2.0 F 3-3-3-3-3 (CATHETERS) IMPLANT
CATH SOUNDSTAR ECO 8FR (CATHETERS) IMPLANT
CATH WEBSTER BI DIR CS D-F CRV (CATHETERS) IMPLANT
CLOSURE PERCLOSE PROSTYLE (VASCULAR PRODUCTS) IMPLANT
COVER SWIFTLINK CONNECTOR (BAG) ×1 IMPLANT
DEVICE CLOSURE MYNXGRIP 6/7F (Vascular Products) IMPLANT
DILATOR VESSEL 38 20CM 16FR (INTRODUCER) IMPLANT
GUIDEWIRE INQWIRE 1.5J.035X260 (WIRE) IMPLANT
INQWIRE 1.5J .035X260CM (WIRE) ×1 IMPLANT
KIT VERSACROSS CNCT FARADRIVE (KITS) IMPLANT
PACK EP LF (CUSTOM PROCEDURE TRAY) ×1 IMPLANT
PAD DEFIB RADIO PHYSIO CONN (PAD) ×1 IMPLANT
PATCH CARTO3 (PAD) IMPLANT
SHEATH FARADRIVE STEERABLE (SHEATH) IMPLANT
SHEATH PINNACLE 8F 10CM (SHEATH) IMPLANT
SHEATH PINNACLE 9F 10CM (SHEATH) IMPLANT
SHEATH PROBE COVER 6X72 (BAG) IMPLANT

## 2023-09-27 NOTE — H&P (Signed)
Electrophysiology Office Note:    Date:  09/27/2023   ID:  David Munoz, DOB 24-Dec-1945, MRN [id number]  PCP:  Emilio Aspen, MD   [facility] Providers Cardiologist:  Thurmon Fair, MD     Referring MD: No ref. provider found   History of Present Illness:    David Munoz is a 78 y.o. male with a medical history significant for persistent atrial fibrillation, hypertension positive coronary calcium, referred for management of atrial fibrillation .     I discussed the use of AI scribe software for clinical note transcription with the patient, who gave verbal consent to proceed.  The patient, with a history of atrial fibrillation diagnosed on July 31st, presents for discussion of treatment options. The diagnosis was incidental during a cardiology visit for follow-up of a stable aortic aneurysm. They report a decrease in aerobic capacity, particularly noticeable during exercise. They describe an episode of tachycardia during a recent workout, where their heart rate exceeded 145 bpm, leading them to stop exercising. They also report new-onset insomnia, which may be related to their atrial fibrillation. They deny any symptoms of peripheral neuropathy.  He underwent DC cardioversion on September 13.  He has since had recurrence of atrial fibrillation     Today, he reports that he is at baseline.  At rest he feels fine but his exertional capacity is limited.  I reviewed the patient's CT and labs. There was no LAA thrombus. he  has not missed any doses of anticoagulation, and he took his dose last night. There have been no changes in the patient's diagnoses, medications, or condition since our recent clinic visit.   EKGs/Labs/Other Studies Reviewed Today:     Echocardiogram:  Transthoracic echocardiogram April 01, 2023 LVEF 60 to 65% with mild concentric LVH.  Severe bilateral atrial dilation.    EKG:         Physical Exam:    VS:  BP (!) 148/101   Pulse 71    Temp 98 F (36.7 C) (Oral)   Resp 17   Ht 5\' 7"  (1.702 m)   Wt 72.6 kg   SpO2 94%   BMI 25.06 kg/m     Wt Readings from Last 3 Encounters:  09/27/23 72.6 kg  07/26/23 73.9 kg  07/01/23 73 kg     GEN: Well nourished, well developed in no acute distress CARDIAC: iRRR, no murmurs, rubs, gallops RESPIRATORY:  Normal work of breathing MUSCULOSKELETAL: no edema    ASSESSMENT & PLAN:     Persistent atrial fibrillation Symptomatic with reduced exercise capacity Has had recurrence after DC cardioversion April 23, 2023 We discussed management options he would like to undergo atrial fibrillation ablation with pulsed field technology He is currently preparing to move to [facility] with a planned move date sometime in February likely I am currently booked up past his move date, but we may build to work him in at a sooner time if there is a cancellation  We discussed the indication, rationale, logistics, anticipated benefits, and potential risks of the ablation procedure including but not limited to -- bleed at the groin access site, chest pain, damage to nearby organs such as the diaphragm, lungs, or esophagus, need for a drainage tube, or prolonged hospitalization. I explained that the risk for stroke, heart attack, need for open chest surgery, or even death is very low but not zero. he  expressed understanding and wishes to proceed.   Secondary hypercoagulable state Continue apixaban 5  mg twice daily    Signed, [name], MD  09/27/2023 6:59 AM    [facility]

## 2023-09-27 NOTE — Discharge Instructions (Signed)

## 2023-09-27 NOTE — Transfer of Care (Signed)
Immediate Anesthesia Transfer of Care Note  Patient: David Munoz  Procedure(s) Performed: ATRIAL FIBRILLATION ABLATION  Patient Location: PACU  Anesthesia Type:General  Level of Consciousness: awake, alert , and oriented  Airway & Oxygen Therapy: Patient Spontanous Breathing  Post-op Assessment: Report given to RN and Post -op Vital signs reviewed and stable  Post vital signs: Reviewed and stable  Last Vitals:  Vitals Value Taken Time  BP    Temp    Pulse    Resp    SpO2      Last Pain:  Vitals:   09/27/23 0617  TempSrc:   PainSc: 0-No pain      Patients Stated Pain Goal: 4 (09/27/23 0617)  Complications: No notable events documented.

## 2023-09-27 NOTE — Anesthesia Procedure Notes (Signed)
Procedure Name: Intubation Date/Time: 09/27/2023 8:19 AM  Performed by: Hali Marry, CRNAPre-anesthesia Checklist: Patient identified, Emergency Drugs available, Suction available and Patient being monitored Patient Re-evaluated:Patient Re-evaluated prior to induction Oxygen Delivery Method: Circle system utilized Preoxygenation: Pre-oxygenation with 100% oxygen Induction Type: IV induction Ventilation: Mask ventilation without difficulty Laryngoscope Size: Mac and 4 Grade View: Grade I Tube type: Oral Tube size: 7.0 mm Number of attempts: 1 Airway Equipment and Method: Stylet and Oral airway Placement Confirmation: ETT inserted through vocal cords under direct vision, positive ETCO2 and breath sounds checked- equal and bilateral Secured at: 22 cm Tube secured with: Tape Dental Injury: Teeth and Oropharynx as per pre-operative assessment

## 2023-09-27 NOTE — Anesthesia Preprocedure Evaluation (Addendum)
Anesthesia Evaluation  Patient identified by MRN, date of birth, ID band Patient awake    Reviewed: Allergy & Precautions, NPO status , Patient's Chart, lab work & pertinent test results  Airway Mallampati: II  TM Distance: >3 FB Neck ROM: Full    Dental no notable dental hx.    Pulmonary neg pulmonary ROS   Pulmonary exam normal        Cardiovascular hypertension, Pt. on medications and Pt. on home beta blockers + CAD  + dysrhythmias Atrial Fibrillation  Rhythm:Irregular Rate:Normal  ECHO:   1. Left ventricular ejection fraction, by estimation, is 60 to 65%. The  left ventricle has normal function. The left ventricle has no regional  wall motion abnormalities. There is mild concentric left ventricular  hypertrophy. Left ventricular diastolic  parameters are indeterminate.   2. Right ventricular systolic function is normal. The right ventricular  size is mildly enlarged.   3. Left atrial size was severely dilated.   4. Right atrial size was severely dilated.   5. The mitral valve is normal in structure. Mild to moderate mitral valve  regurgitation. No evidence of mitral stenosis.   6. The aortic valve is normal in structure. Aortic valve regurgitation is  not visualized. Aortic valve sclerosis is present, with no evidence of  aortic valve stenosis.   7. There is mild dilatation of the aortic root, measuring 39 mm. There is  moderate dilatation of the ascending aorta, measuring 44 mm.   8. The inferior vena cava is normal in size with greater than 50%  respiratory variability, suggesting right atrial pressure of 3 mmHg.     Neuro/Psych negative neurological ROS  negative psych ROS   GI/Hepatic negative GI ROS, Neg liver ROS,,,  Endo/Other  negative endocrine ROS    Renal/GU negative Renal ROS  negative genitourinary   Musculoskeletal negative musculoskeletal ROS (+)    Abdominal Normal abdominal exam  (+)    Peds  Hematology Lab Results      Component                Value               Date                      WBC                      5.5                 08/31/2023                HGB                      15.3                08/31/2023                HCT                      45.3                08/31/2023                MCV                      92                  08/31/2023  PLT                      263                 08/31/2023              Anesthesia Other Findings   Reproductive/Obstetrics                             Anesthesia Physical Anesthesia Plan  ASA: 3  Anesthesia Plan: General   Post-op Pain Management:    Induction: Intravenous  PONV Risk Score and Plan: 2 and Ondansetron, Dexamethasone and Treatment may vary due to age or medical condition  Airway Management Planned: Mask and Oral ETT  Additional Equipment: None  Intra-op Plan:   Post-operative Plan: Extubation in OR  Informed Consent: I have reviewed the patients History and Physical, chart, labs and discussed the procedure including the risks, benefits and alternatives for the proposed anesthesia with the patient or authorized representative who has indicated his/her understanding and acceptance.     Dental advisory given  Plan Discussed with: CRNA  Anesthesia Plan Comments:        Anesthesia Quick Evaluation

## 2023-09-27 NOTE — Progress Notes (Signed)
 Pt ambulated to and from bathroom with no signs of oozing from bilateral groin sites

## 2023-09-27 NOTE — Anesthesia Postprocedure Evaluation (Signed)
Anesthesia Post Note  Patient: David Munoz  Procedure(s) Performed: ATRIAL FIBRILLATION ABLATION     Patient location during evaluation: PACU Anesthesia Type: General Level of consciousness: awake and alert Pain management: pain level controlled Vital Signs Assessment: post-procedure vital signs reviewed and stable Respiratory status: spontaneous breathing, nonlabored ventilation, respiratory function stable and patient connected to nasal cannula oxygen Cardiovascular status: blood pressure returned to baseline and stable Postop Assessment: no apparent nausea or vomiting Anesthetic complications: no   No notable events documented.  Last Vitals:  Vitals:   09/27/23 1200 09/27/23 1300  BP: 127/87 134/88  Pulse: 74 73  Resp: 19 (!) 22  Temp:    SpO2: 98% 97%    Last Pain:  Vitals:   09/27/23 1021  TempSrc:   PainSc: 0-No pain                 Earl Lites P Daney Moor

## 2023-09-28 ENCOUNTER — Telehealth (HOSPITAL_COMMUNITY): Payer: Self-pay

## 2023-09-28 NOTE — Telephone Encounter (Signed)
Spoke with patient to complete post procedure follow up call.  Patient reports no complications with groin sites.   Instructions reviewed with patient:  Remove large bandage at puncture site after 24 hours. It is normal to have bruising, tenderness and a pea or marble sized lump/knot at the groin site which can take up to three months to resolve.  Get help right away if you notice sudden swelling at the puncture site.  Check your puncture site every day for signs of infection: fever, redness, swelling, pus drainage, warmth, foul odor or excessive pain. If this occurs, please call the office at [phone], to speak with the nurse. Get help right away if your puncture site is bleeding and the bleeding does not stop after applying firm pressure to the area.  You may continue to have skipped beats/ atrial fibrillation during the first several months after your procedure.  It is very important not to miss any doses of your blood thinner Eliquis. Patient restarted taking this medication on yesterday, 09/27/23.   You will follow up with the Afib clinic on 10/27/23 and follow up with the APP on 12/27/23.  Patient verbalized understanding to all instructions provided.

## 2023-09-29 ENCOUNTER — Telehealth: Payer: Self-pay | Admitting: Cardiology

## 2023-09-29 NOTE — Telephone Encounter (Signed)
David Munoz called into the answering service after having an ablation for his atrial fibrillation earlier this week.  He stated the day after his procedure his groin was slightly sore but no swelling or warmth.  This morning he stated when he had woken both of his groins were still slightly sore, pink in color, but no real swelling.  Later on this afternoon his left groin is increasingly swollen greater than the right side, red in color, and warm to the touch.  He was concerned that he may need an antibiotic.  He stated that the site was not oozing blood or any type of pus and he had not had any fevers at this point. He is able to ambulate without pain or difficulty and denies any back pain or bruising to his lower back.  He stated he did have an appointment with urgent care within the next 30 minutes to be evaluated.  He was encouraged to go ahead with his urgent care appointment to ensure he did not have a hematoma in that left groin due to the acute swelling process.  We discussed hematomas versus infectious process.  His questions were answered and he was advised for any other concerns or questions to feel free to call back.

## 2023-10-03 MED FILL — Fentanyl Citrate Preservative Free (PF) Inj 100 MCG/2ML: INTRAMUSCULAR | Qty: 2 | Status: AC

## 2023-10-07 ENCOUNTER — Encounter: Payer: Self-pay | Admitting: Emergency Medicine

## 2023-10-25 ENCOUNTER — Ambulatory Visit (HOSPITAL_COMMUNITY)
Admission: RE | Admit: 2023-10-25 | Discharge: 2023-10-25 | Disposition: A | Discharge status: Home / Self Care | Payer: Federal, State, Local not specified - PPO | Source: Ambulatory Visit | Attending: Physician Assistant | Admitting: Physician Assistant

## 2023-10-25 ENCOUNTER — Encounter (HOSPITAL_COMMUNITY): Payer: Self-pay | Admitting: Physician Assistant

## 2023-10-25 VITALS — BP 140/90 | HR 65 | Ht 67.0 in | Wt 159.6 lb

## 2023-10-25 DIAGNOSIS — I7121 Aneurysm of the ascending aorta, without rupture: Secondary | ICD-10-CM | POA: Diagnosis not present

## 2023-10-25 DIAGNOSIS — D6869 Other thrombophilia: Secondary | ICD-10-CM

## 2023-10-25 DIAGNOSIS — I4819 Other persistent atrial fibrillation: Secondary | ICD-10-CM | POA: Diagnosis not present

## 2023-10-25 DIAGNOSIS — I10 Essential (primary) hypertension: Secondary | ICD-10-CM | POA: Insufficient documentation

## 2023-10-25 DIAGNOSIS — E785 Hyperlipidemia, unspecified: Secondary | ICD-10-CM | POA: Diagnosis present

## 2023-10-25 DIAGNOSIS — Z7901 Long term (current) use of anticoagulants: Secondary | ICD-10-CM | POA: Insufficient documentation

## 2023-10-25 DIAGNOSIS — I2510 Atherosclerotic heart disease of native coronary artery without angina pectoris: Secondary | ICD-10-CM | POA: Diagnosis present

## 2023-10-25 IMAGING — CT CT ANGIO CHEST
3 of 6 series · 18 of 46 positions shown · IV contrast (APPLIED)
Comparison: January 24, 2021

CLINICAL DATA: Thoracic aortic aneurysm ([place]), follow up

EXAM:
CT ANGIOGRAPHY CHEST WITH CONTRAST
TECHNIQUE: Multidetector CT imaging of the chest was performed using the
standard protocol during bolus administration of intravenous
contrast. Multiplanar CT image reconstructions and MIPs were
obtained to evaluate the vascular anatomy.

[Series 4: axial arterial · axial · arterial · 0.75mm/px · z∈[+1376,+1619]mm · 11 of 99 slices shown]
[im 9/99  lung]
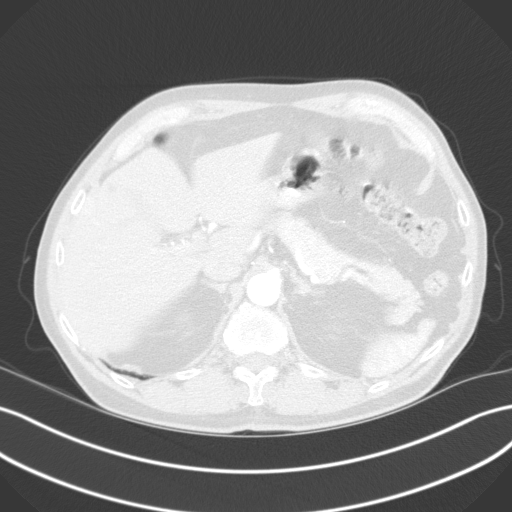
[im 17/99  soft-tissue]
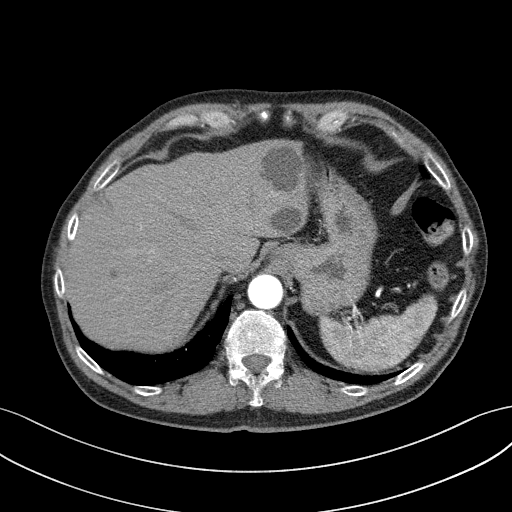
[im 25/99  lung]
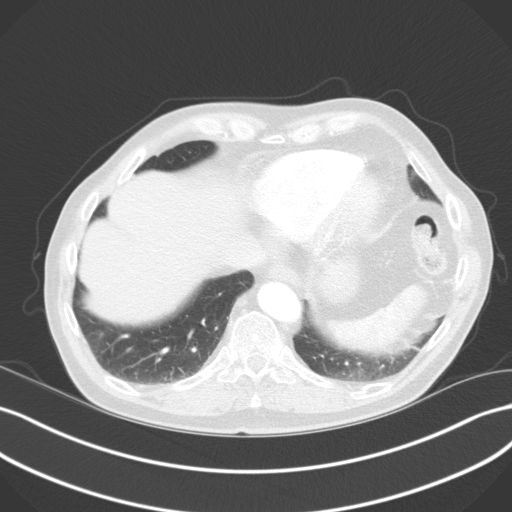
[im 33/99  soft-tissue]
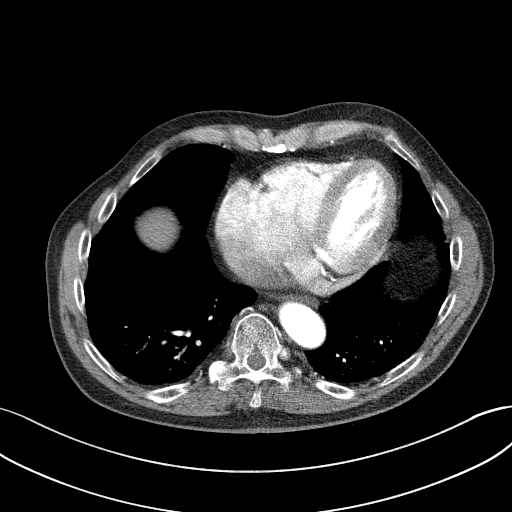
[im 41/99  lung]
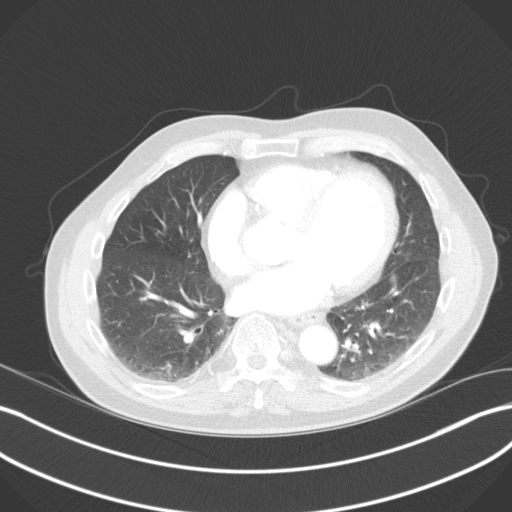
[im 50/99  soft-tissue]
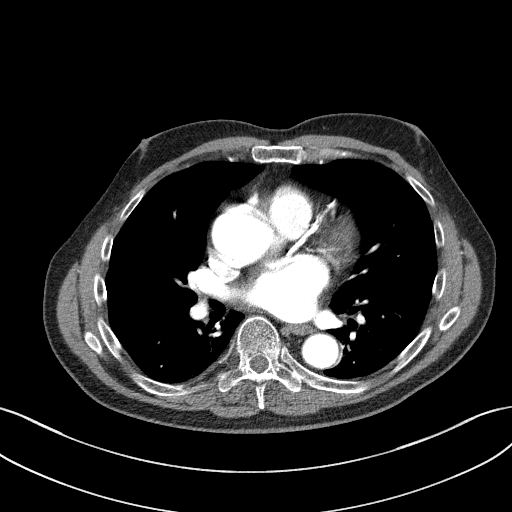
[im 58/99  lung]
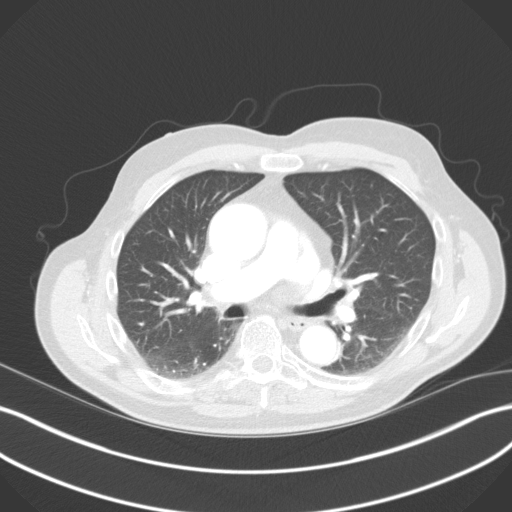
[im 66/99  soft-tissue]
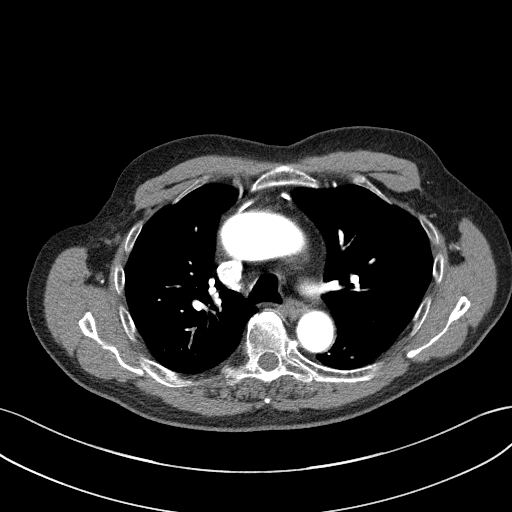
[im 74/99  lung]
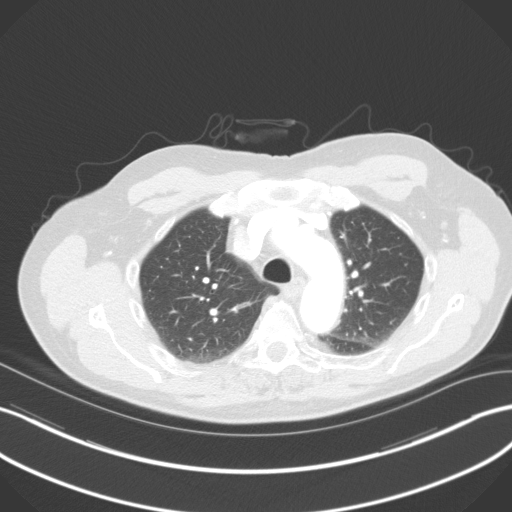
[im 82/99  soft-tissue]
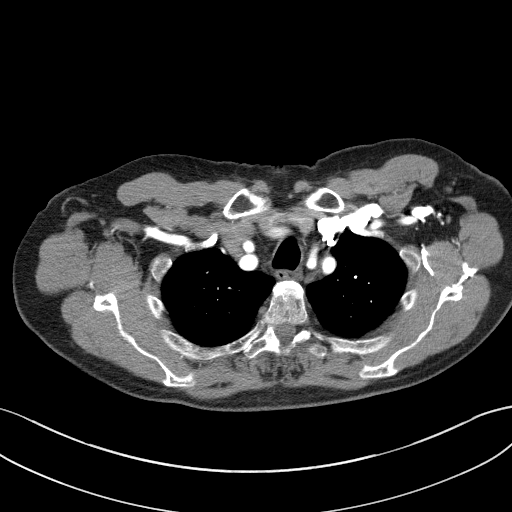
[im 90/99  lung]
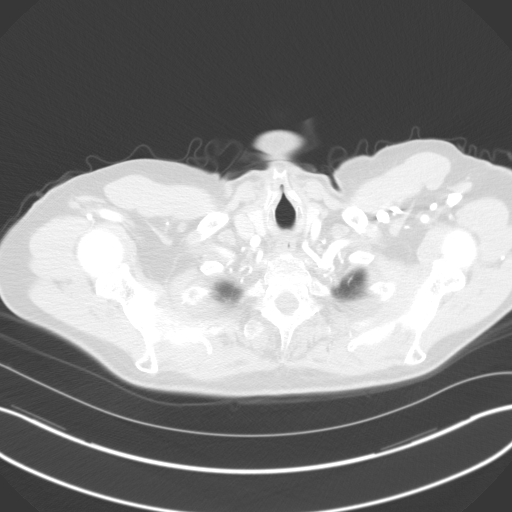

[Series 6: lung · axial · 0.75mm/px · z∈[+1382,+1480]mm · 4 of 149 slices shown]
[im 17/149  soft-tissue]
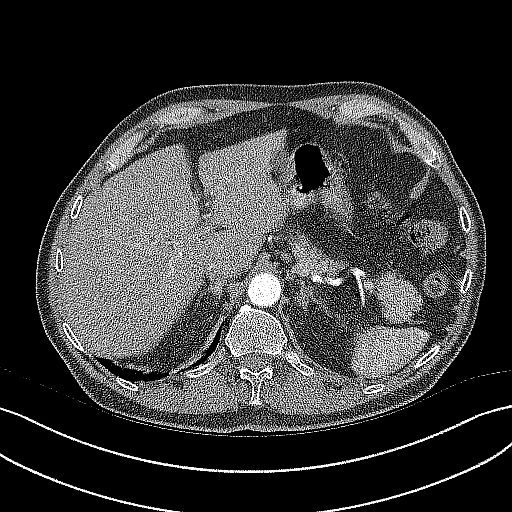
[im 33/149  soft-tissue]
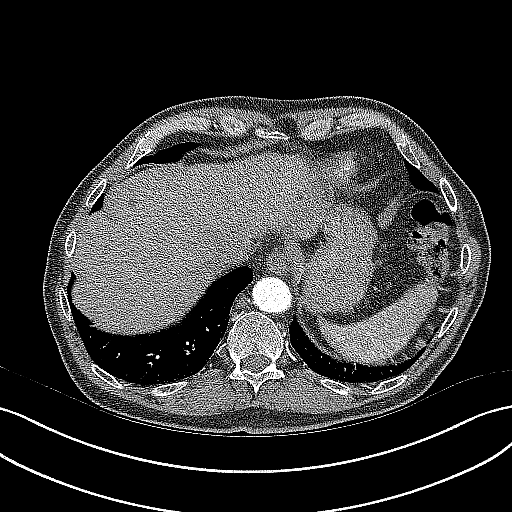
[im 50/149  soft-tissue]
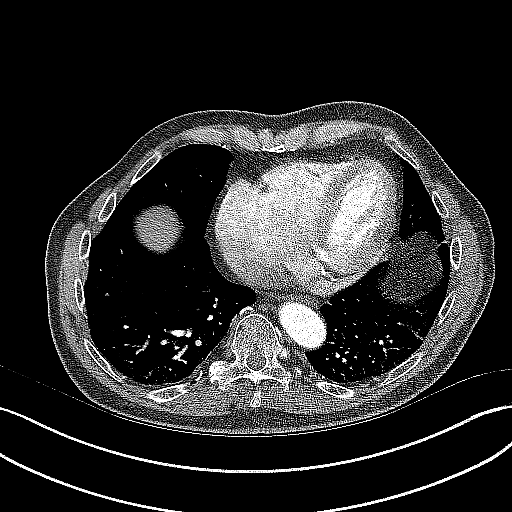
[im 66/149  soft-tissue]
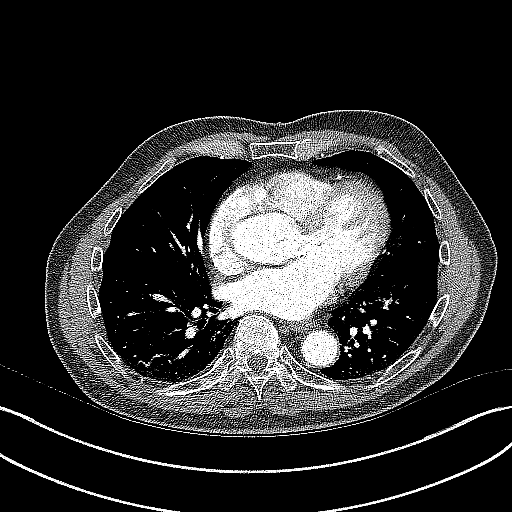

[Series 7: coronal · coronal · 0.61mm/px · 3 of 86 slices shown]
[im 22/86  soft-tissue]
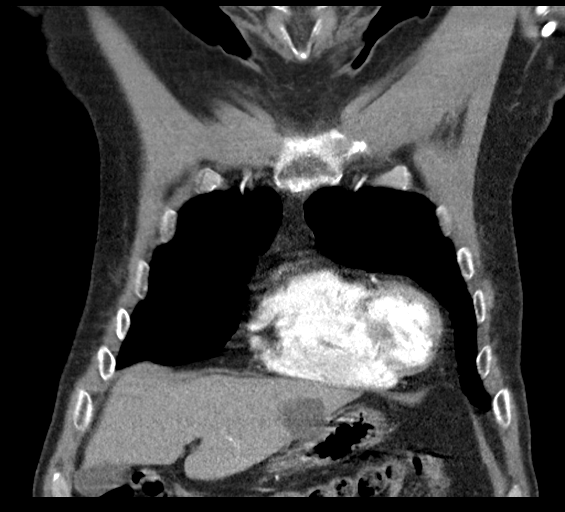
[im 43/86  soft-tissue]
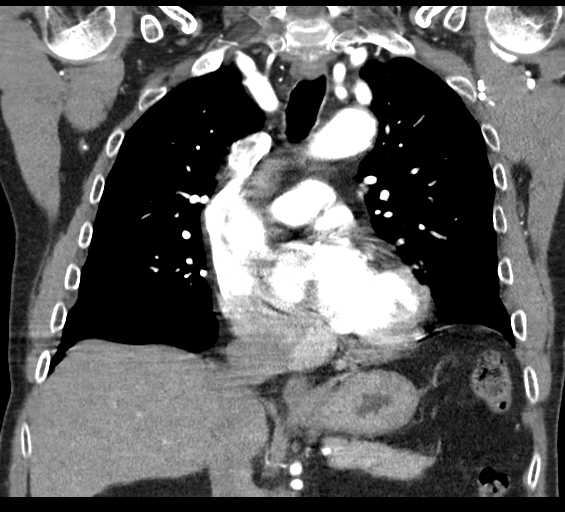
[im 64/86  soft-tissue]
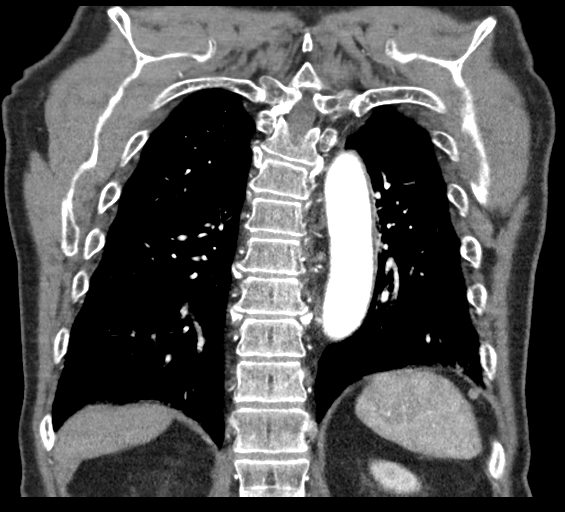

[18 of 46 positions shown; findings below may reference images not displayed]

RADIATION DOSE REDUCTION: This exam was performed according to the
departmental dose-optimization program which includes automated
exposure control, adjustment of the mA and/or kV according to
patient size and/or use of iterative reconstruction technique.

CONTRAST:  80mL OMNIPAQUE IOHEXOL 350 MG/ML SOLN
FINDINGS: Cardiovascular: Preferential opacification of the thoracic aorta. As
before, ascending thoracic aorta measures 4.3 cm in greatest
dimension, stable. No dissection or intramural hematoma. Mild
atheromatous calcifications in the arch of the aorta. Moderate
coronary artery calcifications. Normal heart size. No pericardial
effusion.

Mediastinum/Nodes: No mediastinal mass or significant
lymphadenopathy.

Lungs/Pleura: Small areas of atelectasis at the lingula and the left
lower lobe.

Upper Abdomen: As before, there are multiple hypodense likely cysts
seen in the liver with the largest cyst measuring 3.5 x 3.1 cm in
the lateral segment of the left lobe in comparison to [DATE] x 2.7 cm
in the previous study

Musculoskeletal: Mild thoracic spondylosis.

Review of the MIP images confirms the above findings.
IMPRESSION: Ascending thoracic aorta measures 4.3 cm in greatest dimension
without significant interval change. No dissection or intramural
hematoma.

Recommend annual imaging followup by CTA or MRA. This recommendation
follows 5474 ACCF/AHA/AATS/ACR/ASA/SCA/MIKADZE/PHI/STANTON/SVR Guidelines
for the Diagnosis and Management of Patients with Thoracic Aortic
Disease. Circulation. 5474; 121: E266-e369. Aortic aneurysm NOS
([id number])

Multiple hepatic cysts with the largest cyst measuring 3.5 x 3.1 cm
in comparison to [DATE] x 2.2 cm in previous study.

## 2023-10-25 NOTE — Progress Notes (Signed)
 Primary Care Physician: Emilio Aspen, MD Primary Cardiologist: Thurmon Fair, MD Electrophysiologist: [name], MD  Referring Physician: Dr Dionne Ano David Munoz is a 78 y.o. male with a history of HTN, HLD, CAD, ascending aortic aneurysm, atrial fibrillation who presents for follow up in the [facility].  The patient was initially diagnosed with atrial fibrillation 02/2023 incidentally at a routine cardiology follow up. Patient was started on Eliquis  for stroke prevention. And underwent DCCV on 04/23/23. However, he had quick return of his afib and underwent afib ablation on 09/27/23.  Patient presents today for follow up for atrial fibrillation. He reports that he has not had any episodes of afib since the ablation. He checks his David Munoz mobile daily. He did have some left groin swelling post ablation. He was evaluated locally in [place] and groin ultrasound showed a hematoma. His groin issues have resolved.   Today, he denies symptoms of palpitations, chest pain, shortness of breath, orthopnea, PND, lower extremity edema, dizziness, presyncope, syncope, snoring, daytime somnolence, bleeding, or neurologic sequela. The patient is tolerating medications without difficulties and is otherwise without complaint today.    Atrial Fibrillation Risk Factors:  he does not have symptoms or diagnosis of sleep apnea. he does not have a history of rheumatic fever. he does have a history of alcohol use. The patient does not have a history of early familial atrial fibrillation or other arrhythmias.  Atrial Fibrillation Management history:  Previous antiarrhythmic drugs: none Previous cardioversions: 04/23/23 Previous ablations: 09/27/23 Anticoagulation history: Eliquis  ROS- All systems are reviewed and negative except as per the HPI above.  Past Medical History:  Diagnosis Date   A-fib [facility])    Coronary artery disease    Hyperlipidemia     Hypertension     Current Outpatient Medications  Medication Sig Dispense Refill   acetaminophen (TYLENOL) 500 MG tablet Take 1,000 mg by mouth every 6 (six) hours as needed for moderate pain (pain score 4-6).     amLODipine (NORVASC) 5 MG tablet Take 5 mg by mouth daily.     apixaban (ELIQUIS) 5 MG TABS tablet Take 1 tablet (5 mg total) by mouth 2 (two) times daily. 180 tablet 3   atorvastatin (LIPITOR) 20 MG tablet Take 20 mg by mouth at bedtime.     cholecalciferol (VITAMIN D3) 25 MCG (1000 UNIT) tablet Take 1,000 Units by mouth daily.     metoprolol succinate (TOPROL-XL) 25 MG 24 hr tablet Take 25 mg by mouth daily.     Multiple Vitamin (MULTIVITAMINS PO) Take 1 tablet by mouth daily.     Polyethyl Glycol-Propyl Glycol (LUBRICATING EYE DROPS OP) Place 1 drop into both eyes daily as needed (dry eyes).     valsartan (DIOVAN) 320 MG tablet Take 1 tablet (320 mg total) by mouth daily. 90 tablet 3   No current facility-administered medications for this encounter.    Physical Exam: BP (!) 140/90   Pulse 65   Ht 5\' 7"  (1.702 m)   Wt 72.4 kg   BMI 25.00 kg/m   GEN: Well nourished, well developed in no acute distress CARDIAC: Regular rate and rhythm, no murmurs, rubs, gallops RESPIRATORY:  Clear to auscultation without rales, wheezing or rhonchi  ABDOMEN: Soft, non-tender, non-distended EXTREMITIES:  No edema; No deformity   Wt Readings from Last 3 Encounters:  10/25/23 72.4 kg  09/27/23 72.6 kg  07/26/23 73.9 kg     EKG today demonstrates  SR  Vent. rate 65 BPM PR interval 166 ms QRS duration 82 ms QT/QTcB 396/411 ms  Echo 04/01/23 demonstrated   1. Left ventricular ejection fraction, by estimation, is 60 to 65%. The  left ventricle has normal function. The left ventricle has no regional  wall motion abnormalities. There is mild concentric left ventricular  hypertrophy. Left ventricular diastolic parameters are indeterminate.   2. Right ventricular systolic function is  normal. The right ventricular  size is mildly enlarged.   3. Left atrial size was severely dilated.   4. Right atrial size was severely dilated.   5. The mitral valve is normal in structure. Mild to moderate mitral valve  regurgitation. No evidence of mitral stenosis.   6. The aortic valve is normal in structure. Aortic valve regurgitation is  not visualized. Aortic valve sclerosis is present, with no evidence of  aortic valve stenosis.   7. There is mild dilatation of the aortic root, measuring 39 mm. There is  moderate dilatation of the ascending aorta, measuring 44 mm.   8. The inferior vena cava is normal in size with greater than 50%  respiratory variability, suggesting right atrial pressure of 3 mmHg.    CHA2DS2-VASc Score = 4  The patient's score is based upon: CHF History: 0 HTN History: 1 Diabetes History: 0 Stroke History: 0 Vascular Disease History: 1 Age Score: 2 Gender Score: 0       ASSESSMENT AND PLAN: Persistent Atrial Fibrillation (ICD10:  I48.19) The patient's CHA2DS2-VASc score is 4, indicating a 4.8% annual risk of stroke.   S/p afib ablation 09/27/23 Patient appears to be maintaining SR Continue Eliquis 5 mg BID with no missed doses for 3 months post ablation.  Continue Toprol 25 mg daily  Secondary Hypercoagulable State (ICD10:  D68.69) The patient is at significant risk for stroke/thromboembolism based upon his CHA2DS2-VASc Score of 4.  Continue Apixaban (Eliquis). No bleeding issues.   CAD CAC score 738 No anginal symptoms Followed by Dr Royann Shivers.    Follow up with Canary Brim as scheduled. Patient plans to establish care with cardiology in [place] for [place] term follow up.        David Loa PA-C [facility] [address] [place], [place] [address] [phone]

## 2023-11-10 ENCOUNTER — Telehealth: Payer: Self-pay | Admitting: Cardiovascular Disease

## 2023-11-10 NOTE — Telephone Encounter (Signed)
 Lurena Joiner - [facility] calling to request a copy of pt's heart monitor result. She said, it can be fax to [phone] ATTN: Lurena Joiner

## 2023-11-10 NOTE — Telephone Encounter (Signed)
 Spoke to [place] with [facility] 03/26/23 monitor report and strips faxed to her at fax# [phone].

## 2023-12-27 ENCOUNTER — Ambulatory Visit: Payer: Federal, State, Local not specified - PPO | Admitting: Pulmonary Disease

## 2024-01-12 ENCOUNTER — Ambulatory Visit: Payer: Federal, State, Local not specified - PPO | Admitting: Cardiovascular Disease

## 2024-01-19 ENCOUNTER — Encounter: Payer: Self-pay | Admitting: Cardiovascular Disease

## 2024-01-24 ENCOUNTER — Other Ambulatory Visit: Payer: Self-pay | Admitting: Cardiovascular Disease

## 2024-01-24 NOTE — Telephone Encounter (Signed)
 Prescription refill request for Eliquis  received. Indication:afib Last office visit:3/25 Scr:1.11  5/25 Age: 78 Weight:72.4  kg  Prescription refilled
